# Patient Record
Sex: Male | Born: 1945 | Race: White | Hispanic: No | Marital: Married | Smoking: Former smoker
Health system: Southern US, Community
[De-identification: ages and names within clinical notes are randomized; demographics above are authoritative.]

## PROBLEM LIST (undated history)

## (undated) DIAGNOSIS — Z86010 Personal history of colonic polyps: Secondary | ICD-10-CM

## (undated) DIAGNOSIS — Z8719 Personal history of other diseases of the digestive system: Secondary | ICD-10-CM

## (undated) DIAGNOSIS — R0683 Snoring: Secondary | ICD-10-CM

## (undated) DIAGNOSIS — Z85828 Personal history of other malignant neoplasm of skin: Secondary | ICD-10-CM

## (undated) DIAGNOSIS — M545 Other chronic pain: Secondary | ICD-10-CM

## (undated) DIAGNOSIS — L501 Idiopathic urticaria: Secondary | ICD-10-CM

## (undated) DIAGNOSIS — N401 Enlarged prostate with lower urinary tract symptoms: Secondary | ICD-10-CM

## (undated) DIAGNOSIS — M51379 Other intervertebral disc degeneration, lumbosacral region without mention of lumbar back pain or lower extremity pain: Secondary | ICD-10-CM

## (undated) DIAGNOSIS — Z87442 Personal history of urinary calculi: Secondary | ICD-10-CM

## (undated) DIAGNOSIS — Z87438 Personal history of other diseases of male genital organs: Secondary | ICD-10-CM

## (undated) DIAGNOSIS — M5137 Other intervertebral disc degeneration, lumbosacral region: Secondary | ICD-10-CM

## (undated) DIAGNOSIS — G8929 Other chronic pain: Secondary | ICD-10-CM

## (undated) DIAGNOSIS — C61 Malignant neoplasm of prostate: Secondary | ICD-10-CM

## (undated) DIAGNOSIS — H35711 Central serous chorioretinopathy, right eye: Secondary | ICD-10-CM

## (undated) DIAGNOSIS — L508 Other urticaria: Secondary | ICD-10-CM

## (undated) DIAGNOSIS — M47816 Spondylosis without myelopathy or radiculopathy, lumbar region: Secondary | ICD-10-CM

## (undated) DIAGNOSIS — Z87820 Personal history of traumatic brain injury: Secondary | ICD-10-CM

## (undated) DIAGNOSIS — Z860101 Personal history of adenomatous and serrated colon polyps: Secondary | ICD-10-CM

## (undated) HISTORY — PX: PROSTATE BIOPSY: SHX241

## (undated) HISTORY — PX: TONSILLECTOMY: SUR1361

## (undated) HISTORY — PX: CATARACT EXTRACTION W/ INTRAOCULAR LENS  IMPLANT, BILATERAL: SHX1307

## (undated) HISTORY — PX: MOHS SURGERY: SUR867

---

## 2000-11-21 ENCOUNTER — Encounter (INDEPENDENT_AMBULATORY_CARE_PROVIDER_SITE_OTHER): Payer: Self-pay

## 2000-11-21 ENCOUNTER — Ambulatory Visit: Admission: RE | Admit: 2000-11-21 | Discharge: 2000-11-21 | Payer: Self-pay | Admitting: Gastroenterology

## 2004-03-27 ENCOUNTER — Ambulatory Visit (HOSPITAL_COMMUNITY): Admission: RE | Admit: 2004-03-27 | Discharge: 2004-03-27 | Payer: Self-pay | Admitting: Gastroenterology

## 2010-05-27 ENCOUNTER — Ambulatory Visit: Payer: Self-pay | Admitting: Vascular Surgery

## 2010-11-25 ENCOUNTER — Ambulatory Visit: Payer: Self-pay | Admitting: Vascular Surgery

## 2010-11-25 ENCOUNTER — Other Ambulatory Visit: Payer: Self-pay

## 2011-01-12 NOTE — Procedures (Signed)
DUPLEX DEEP VENOUS EXAM - LOWER EXTREMITY   INDICATION:  Right leg pain and swelling.   HISTORY:  Edema:  Occasional right ankle swelling.  Trauma/Surgery:  No.  Pain:  Right anterolateral shin pain for about 2 weeks.  PE:  No.  Previous DVT:  No.  Anticoagulants:  Patient states he takes aspirin.  Other:   DUPLEX EXAM:                CFV   SFV   PopV  PTV    GSV                R  L  R  L  R  L  R   L  R  L  Thrombosis    o  o  o     o     o      o  Spontaneous   +  +  +     +     +      +  Phasic        +  +  +     +     +      +  Augmentation  +  +  +     +     +      +  Compressible  +  +  +     +     +      +  Competent     +  +  +     +     +      +   Legend:  + - yes  o - no  p - partial  D - decreased   IMPRESSION:  No evidence of deep or superficial venous thrombosis noted  in the right lower extremity.    _____________________________  Leonides Sake, MD   CH/MEDQ  D:  05/27/2010  T:  05/27/2010  Job:  [id number]

## 2011-01-15 NOTE — Op Note (Signed)
NAME:  Carl Flores, HOAGLIN                       ACCOUNT NO.:  [id number]   MEDICAL RECORD NO.:  [id number]                   PATIENT TYPE:  AMB   LOCATION:  ENDO                                 FACILITY:  [facility]   PHYSICIAN:  James L. Malon Kindle., M.D.          DATE OF BIRTH:  May 14, 1946   DATE OF PROCEDURE:  03/27/2004  DATE OF DISCHARGE:                                 OPERATIVE REPORT   PROCEDURE:  Colonoscopy.   MEDICATIONS:  Fentanyl 100 mcg, Versed 10 mg IV.   INDICATIONS FOR PROCEDURE:  Strong family history of colon cancer with  previous history of polyps.   DESCRIPTION OF PROCEDURE:  The patient had been explained to the patient and  consent obtained.  The patient in the left lateral decubitus position, the  Olympus scope was inserted and advanced.  The patient had a very long  tortuous colon.  The pediatric adjustable scope was used.  Multiple position  change were made.  Eventually we were able to reach just above the cecum to  the level of the ileocecal valve.  The cecum was seen in the distance, could  not advance down into the tip of the cecum but it was grossly  normal from  there.  The scope was withdrawn and the ascending colon, transverse,  descending and sigmoid colon were seen well and no polyps were seen.  There  was no diverticular disease.  Again, a very long tortuous colon.  The rectum  was free of polyps.  The scope was withdrawn.  The patient tolerated the  procedure well.   ASSESSMENT:  1. History of previous colon polyps with negative colonoscopy, V12.72.  2. Family history of colon cancer, V16.0.   PLAN:  Will recommend yearly Hemoccults, repeating procedure in five years.  Will plan on using the adult colonoscope.                                               James L. Malon Kindle., M.D.    Waldron Session  D:  03/27/2004  T:  03/27/2004  Job:  [id number]   cc:   Marjory Lies, M.D.  [address]  [place]  [place] [address]  Fax: [phone]

## 2011-01-15 NOTE — Procedures (Signed)
[facility]  Patient:    Carl Flores, Carl Flores                      MRN: [id number] Proc. Date: 11/21/00 Attending:  Fayrene Fearing L. Randa Evens, M.D. CC:         Delorse Lek, M.D.   Procedure Report  PROCEDURE:  Colonoscopy and polypectomy.  ENDOSCOPIST:  Llana Aliment. Edwards, M.D.  MEDICATIONS:  Fentanyl 87.5 mcg, Versed 7 mg IV.  INDICATIONS:  Rectal bleeding in a gentleman with a very strong family history of colon cancer and colon polyps.  SCOPE:  Olympus adult video colonoscope.  DESCRIPTION OF PROCEDURE:  The procedure had been explained to the patient and consent obtained.  With the patient in the left lateral decubitus position, the Olympus adult video colonoscope was inserted and advanced under direct visualization.  The prep was excellent.  We were able to reach the transverse colon with the patient in the left lateral decubitus position and then placing him in the supine position, and finally in the right lateral decubitus position and using abdominal pressure, we were able to get down to the cecum. The scope was withdrawn, and the cecum, ascending colon, hepatic flexure, transverse colon, splenic flexure, descending and sigmoid colon were seen well upon removal.  In the proximal descending colon, a 0.75 cm polyp was encountered, removed with the snare, and sucked through the scope.  It did fragment.  There was no active bleeding.  No other polyps were seen in the descending or sigmoid colon.  Internal hemorrhoids were seen in the rectum. The scope was withdrawn.  The patient tolerated the procedure well.  The patient as maintained on low-flow oxygen and pulse oximetry throughout the procedure with no obvious problem.  ASSESSMENT: 1. Descending colon polyp removed. 2. Internal hemorrhoids.  PLAN:  Routine post polypectomy instructions.  Recommend repeat procedure in three years. DD:  11/21/00 TD:  11/22/00 Job: [id number]

## 2011-09-20 DIAGNOSIS — L820 Inflamed seborrheic keratosis: Secondary | ICD-10-CM | POA: Diagnosis not present

## 2011-09-20 DIAGNOSIS — D235 Other benign neoplasm of skin of trunk: Secondary | ICD-10-CM | POA: Diagnosis not present

## 2011-09-20 DIAGNOSIS — L570 Actinic keratosis: Secondary | ICD-10-CM | POA: Diagnosis not present

## 2011-11-23 ENCOUNTER — Ambulatory Visit (INDEPENDENT_AMBULATORY_CARE_PROVIDER_SITE_OTHER): Payer: Medicare Other | Admitting: Ophthalmology

## 2011-11-23 DIAGNOSIS — H43819 Vitreous degeneration, unspecified eye: Secondary | ICD-10-CM | POA: Diagnosis not present

## 2011-11-23 DIAGNOSIS — D3130 Benign neoplasm of unspecified choroid: Secondary | ICD-10-CM

## 2011-11-23 DIAGNOSIS — Z09 Encounter for follow-up examination after completed treatment for conditions other than malignant neoplasm: Secondary | ICD-10-CM | POA: Diagnosis not present

## 2011-11-23 DIAGNOSIS — H35719 Central serous chorioretinopathy, unspecified eye: Secondary | ICD-10-CM | POA: Diagnosis not present

## 2012-01-04 DIAGNOSIS — E8809 Other disorders of plasma-protein metabolism, not elsewhere classified: Secondary | ICD-10-CM | POA: Diagnosis not present

## 2012-01-04 DIAGNOSIS — L509 Urticaria, unspecified: Secondary | ICD-10-CM | POA: Diagnosis not present

## 2012-02-23 DIAGNOSIS — R109 Unspecified abdominal pain: Secondary | ICD-10-CM | POA: Diagnosis not present

## 2012-02-23 DIAGNOSIS — J069 Acute upper respiratory infection, unspecified: Secondary | ICD-10-CM | POA: Diagnosis not present

## 2012-02-23 DIAGNOSIS — R5381 Other malaise: Secondary | ICD-10-CM | POA: Diagnosis not present

## 2012-02-23 DIAGNOSIS — L509 Urticaria, unspecified: Secondary | ICD-10-CM | POA: Diagnosis not present

## 2012-02-23 DIAGNOSIS — R5383 Other fatigue: Secondary | ICD-10-CM | POA: Diagnosis not present

## 2012-03-21 DIAGNOSIS — M25519 Pain in unspecified shoulder: Secondary | ICD-10-CM | POA: Diagnosis not present

## 2012-04-05 DIAGNOSIS — K648 Other hemorrhoids: Secondary | ICD-10-CM | POA: Diagnosis not present

## 2012-04-05 DIAGNOSIS — Z09 Encounter for follow-up examination after completed treatment for conditions other than malignant neoplasm: Secondary | ICD-10-CM | POA: Diagnosis not present

## 2012-04-05 DIAGNOSIS — Z86010 Personal history of colonic polyps: Secondary | ICD-10-CM | POA: Diagnosis not present

## 2012-04-06 DIAGNOSIS — Z13220 Encounter for screening for lipoid disorders: Secondary | ICD-10-CM | POA: Diagnosis not present

## 2012-04-06 DIAGNOSIS — Z125 Encounter for screening for malignant neoplasm of prostate: Secondary | ICD-10-CM | POA: Diagnosis not present

## 2012-04-06 DIAGNOSIS — Z0000 Encounter for general adult medical examination without abnormal findings: Secondary | ICD-10-CM | POA: Diagnosis not present

## 2012-04-06 DIAGNOSIS — R1900 Intra-abdominal and pelvic swelling, mass and lump, unspecified site: Secondary | ICD-10-CM | POA: Diagnosis not present

## 2012-04-06 DIAGNOSIS — Z136 Encounter for screening for cardiovascular disorders: Secondary | ICD-10-CM | POA: Diagnosis not present

## 2012-06-11 DIAGNOSIS — Z23 Encounter for immunization: Secondary | ICD-10-CM | POA: Diagnosis not present

## 2012-07-04 DIAGNOSIS — L509 Urticaria, unspecified: Secondary | ICD-10-CM | POA: Diagnosis not present

## 2012-07-04 DIAGNOSIS — E8809 Other disorders of plasma-protein metabolism, not elsewhere classified: Secondary | ICD-10-CM | POA: Diagnosis not present

## 2012-10-02 DIAGNOSIS — IMO0002 Reserved for concepts with insufficient information to code with codable children: Secondary | ICD-10-CM | POA: Diagnosis not present

## 2012-10-23 DIAGNOSIS — N411 Chronic prostatitis: Secondary | ICD-10-CM | POA: Diagnosis not present

## 2012-10-23 DIAGNOSIS — N400 Enlarged prostate without lower urinary tract symptoms: Secondary | ICD-10-CM | POA: Diagnosis not present

## 2012-11-22 ENCOUNTER — Ambulatory Visit (INDEPENDENT_AMBULATORY_CARE_PROVIDER_SITE_OTHER): Payer: Medicare Other | Admitting: Ophthalmology

## 2013-01-08 ENCOUNTER — Ambulatory Visit (INDEPENDENT_AMBULATORY_CARE_PROVIDER_SITE_OTHER): Payer: Medicare Other | Admitting: Ophthalmology

## 2013-01-08 DIAGNOSIS — H43819 Vitreous degeneration, unspecified eye: Secondary | ICD-10-CM | POA: Diagnosis not present

## 2013-01-08 DIAGNOSIS — D3130 Benign neoplasm of unspecified choroid: Secondary | ICD-10-CM

## 2013-01-08 DIAGNOSIS — Z09 Encounter for follow-up examination after completed treatment for conditions other than malignant neoplasm: Secondary | ICD-10-CM | POA: Diagnosis not present

## 2013-01-08 DIAGNOSIS — H35719 Central serous chorioretinopathy, unspecified eye: Secondary | ICD-10-CM

## 2013-01-09 DIAGNOSIS — L509 Urticaria, unspecified: Secondary | ICD-10-CM | POA: Insufficient documentation

## 2013-01-09 DIAGNOSIS — H35719 Central serous chorioretinopathy, unspecified eye: Secondary | ICD-10-CM | POA: Insufficient documentation

## 2013-04-10 DIAGNOSIS — Z0000 Encounter for general adult medical examination without abnormal findings: Secondary | ICD-10-CM | POA: Diagnosis not present

## 2013-04-10 DIAGNOSIS — Z125 Encounter for screening for malignant neoplasm of prostate: Secondary | ICD-10-CM | POA: Diagnosis not present

## 2013-04-10 DIAGNOSIS — Z79899 Other long term (current) drug therapy: Secondary | ICD-10-CM | POA: Diagnosis not present

## 2013-04-10 DIAGNOSIS — L509 Urticaria, unspecified: Secondary | ICD-10-CM | POA: Diagnosis not present

## 2013-04-10 DIAGNOSIS — R1900 Intra-abdominal and pelvic swelling, mass and lump, unspecified site: Secondary | ICD-10-CM | POA: Diagnosis not present

## 2013-04-15 ENCOUNTER — Emergency Department (HOSPITAL_COMMUNITY)
Admission: EM | Admit: 2013-04-15 | Discharge: 2013-04-15 | Disposition: A | Discharge status: Home / Self Care | Payer: Medicare Other | Attending: Emergency Medicine | Admitting: Emergency Medicine

## 2013-04-15 ENCOUNTER — Emergency Department (HOSPITAL_COMMUNITY): Payer: Medicare Other

## 2013-04-15 ENCOUNTER — Encounter (HOSPITAL_COMMUNITY): Payer: Self-pay | Admitting: *Deleted

## 2013-04-15 DIAGNOSIS — Z7982 Long term (current) use of aspirin: Secondary | ICD-10-CM | POA: Insufficient documentation

## 2013-04-15 DIAGNOSIS — K5900 Constipation, unspecified: Secondary | ICD-10-CM | POA: Diagnosis not present

## 2013-04-15 DIAGNOSIS — IMO0001 Reserved for inherently not codable concepts without codable children: Secondary | ICD-10-CM | POA: Diagnosis not present

## 2013-04-15 DIAGNOSIS — R110 Nausea: Secondary | ICD-10-CM | POA: Insufficient documentation

## 2013-04-15 DIAGNOSIS — M549 Dorsalgia, unspecified: Secondary | ICD-10-CM | POA: Insufficient documentation

## 2013-04-15 DIAGNOSIS — R109 Unspecified abdominal pain: Secondary | ICD-10-CM | POA: Diagnosis not present

## 2013-04-15 DIAGNOSIS — Z79899 Other long term (current) drug therapy: Secondary | ICD-10-CM | POA: Insufficient documentation

## 2013-04-15 DIAGNOSIS — N200 Calculus of kidney: Secondary | ICD-10-CM | POA: Diagnosis not present

## 2013-04-15 DIAGNOSIS — R079 Chest pain, unspecified: Secondary | ICD-10-CM | POA: Diagnosis not present

## 2013-04-15 DIAGNOSIS — N201 Calculus of ureter: Secondary | ICD-10-CM | POA: Insufficient documentation

## 2013-04-15 LAB — COMPREHENSIVE METABOLIC PANEL
AST: 32 U/L (ref 0–37)
Albumin: 4 g/dL (ref 3.5–5.2)
Alkaline Phosphatase: 77 U/L (ref 39–117)
CO2: 26 mEq/L (ref 19–32)
Chloride: 101 mEq/L (ref 96–112)
Creatinine, Ser: 1.27 mg/dL (ref 0.50–1.35)
GFR calc non Af Amer: 56 mL/min — ABNORMAL LOW (ref >90)
Potassium: 4.4 mEq/L (ref 3.5–5.1)
Total Bilirubin: 0.4 mg/dL (ref 0.3–1.2)

## 2013-04-15 LAB — CBC WITH DIFFERENTIAL/PLATELET
Basophils Absolute: 0 10*3/uL (ref 0.0–0.1)
Basophils Relative: 0 % (ref 0–1)
HCT: 44.3 % (ref 39.0–52.0)
Hemoglobin: 15.1 g/dL (ref 13.0–17.0)
Lymphocytes Relative: 10 % — ABNORMAL LOW (ref 12–46)
MCHC: 34.1 g/dL (ref 30.0–36.0)
Monocytes Absolute: 0.3 10*3/uL (ref 0.1–1.0)
Monocytes Relative: 5 % (ref 3–12)
Neutro Abs: 5.1 10*3/uL (ref 1.7–7.7)
Neutrophils Relative %: 84 % — ABNORMAL HIGH (ref 43–77)
RDW: 12.8 % (ref 11.5–15.5)
WBC: 6.1 10*3/uL (ref 4.0–10.5)

## 2013-04-15 LAB — URINALYSIS, ROUTINE W REFLEX MICROSCOPIC
Bilirubin Urine: NEGATIVE
Glucose, UA: NEGATIVE mg/dL
Ketones, ur: NEGATIVE mg/dL
Protein, ur: NEGATIVE mg/dL
Urobilinogen, UA: 0.2 mg/dL (ref 0.0–1.0)

## 2013-04-15 MED ORDER — KETOROLAC TROMETHAMINE 10 MG PO TABS: 10.0000 mg | ORAL_TABLET | Freq: Four times a day (QID) | ORAL | Status: DC | PRN

## 2013-04-15 MED ORDER — POLYETHYLENE GLYCOL 3350 17 G PO PACK: 17.0000 g | PACK | Freq: Every day | ORAL | Status: DC

## 2013-04-15 MED ORDER — KETOROLAC TROMETHAMINE 30 MG/ML IJ SOLN
30.0000 mg | Freq: Once | INTRAMUSCULAR | Status: AC
  Administered 2013-04-15: 30 mg via INTRAVENOUS
  Filled 2013-04-15: qty 1

## 2013-04-15 MED ORDER — METHOCARBAMOL 100 MG/ML IJ SOLN
500.0000 mg | Freq: Once | INTRAMUSCULAR | Status: DC
  Filled 2013-04-15: qty 5

## 2013-04-15 MED ORDER — SODIUM CHLORIDE 0.9 % IV BOLUS (SEPSIS)
500.0000 mL | Freq: Once | INTRAVENOUS | Status: AC
  Administered 2013-04-15: 500 mL via INTRAVENOUS

## 2013-04-15 MED ORDER — HYDROCODONE-ACETAMINOPHEN 5-325 MG PO TABS: 1.0000 | ORAL_TABLET | ORAL | Status: DC | PRN

## 2013-04-15 MED ORDER — ONDANSETRON 4 MG PO TBDP: ORAL_TABLET | ORAL | Status: DC

## 2013-04-15 MED ORDER — TAMSULOSIN HCL 0.4 MG PO CAPS: 0.4000 mg | ORAL_CAPSULE | Freq: Every day | ORAL | Status: DC

## 2013-04-15 NOTE — ED Notes (Signed)
Lt lower back paqin lt sided since 0500am today.  No bloody urine voids ok

## 2013-04-15 NOTE — ED Notes (Signed)
Sudden onset Right flank pain-- started 0530-- with a wave of nausea-- woke up from sleep.

## 2013-04-15 NOTE — ED Provider Notes (Signed)
CSN: [id number]     Arrival date & time 04/15/13  8295 History     First MD Initiated Contact with Patient 04/15/13 0720     Chief Complaint  Patient presents with  . Flank Pain   (Consider location/radiation/quality/duration/timing/severity/associated sxs/prior Treatment) HPI Pt woke at 0500 with R flank pain. Pain is constant and worse with palpation and movement. No urinary symptoms. No fever or chills. Pt denies trauma though admits to working out yesterday. No radiation to abd or down legs. No weakness or numbness. +episode of nausea that has passed.   History reviewed. No pertinent past medical history. History reviewed. No pertinent past surgical history. No family history on file. History  Substance Use Topics  . Smoking status: Never Smoker   . Smokeless tobacco: Not on file  . Alcohol Use: Yes    Review of Systems  Constitutional: Negative for fever, chills and fatigue.  HENT: Negative for neck pain.   Respiratory: Negative for cough, shortness of breath and wheezing.   Cardiovascular: Negative for chest pain.  Gastrointestinal: Positive for nausea. Negative for vomiting, abdominal pain, diarrhea and constipation.  Genitourinary: Positive for flank pain. Negative for dysuria, frequency and hematuria.  Musculoskeletal: Positive for myalgias and back pain.  Skin: Negative for rash and wound.  Neurological: Negative for dizziness, weakness, light-headedness, numbness and headaches.  All other systems reviewed and are negative.    Allergies  Erythromycin and Sulfa antibiotics  Home Medications   Current Outpatient Rx  Name  Route  Sig  Dispense  Refill  . aspirin 325 MG tablet   Oral   Take 325 mg by mouth daily.         Marland Kitchen Bioflavonoid Products (GRAPE SEED PO)   Oral   Take by mouth.         . cetirizine (ZYRTEC) 10 MG tablet   Oral   Take 20 mg by mouth daily.         . ciprofloxacin (CIPRO) 250 MG tablet   Oral   Take 250 mg by mouth daily as  needed (for pain).         . hydroxychloroquine (PLAQUENIL) 200 MG tablet   Oral   Take 200 mg by mouth 2 (two) times daily.         . Multiple Vitamins-Minerals (OCUVITE PO)   Oral   Take 1 tablet by mouth daily.         . ranitidine (ZANTAC) 150 MG tablet   Oral   Take 150 mg by mouth daily.         Marland Kitchen Specialty Vitamins Products (PROSTATE PO)   Oral   Take 1 tablet by mouth daily.         Marland Kitchen HYDROcodone-acetaminophen (NORCO) 5-325 MG per tablet   Oral   Take 1 tablet by mouth every 4 (four) hours as needed for pain.   10 tablet   0   . ketorolac (TORADOL) 10 MG tablet   Oral   Take 1 tablet (10 mg total) by mouth every 6 (six) hours as needed for pain.   20 tablet   0   . ondansetron (ZOFRAN ODT) 4 MG disintegrating tablet      4mg  ODT q4 hours prn nausea/vomit   8 tablet   0   . polyethylene glycol (MIRALAX / GLYCOLAX) packet   Oral   Take 17 g by mouth daily.   14 each   0   . tamsulosin (FLOMAX) 0.4 MG CAPS  capsule   Oral   Take 1 capsule (0.4 mg total) by mouth daily.   10 capsule   0    BP 106/55  Pulse 55  Temp(Src) 98.1 F (36.7 C) (Oral)  Resp 20  SpO2 97% Physical Exam  Nursing note and vitals reviewed. Constitutional: He is oriented to person, place, and time. He appears well-developed and well-nourished. No distress.  HENT:  Head: Normocephalic and atraumatic.  Mouth/Throat: Oropharynx is clear and moist.  Eyes: EOM are normal. Pupils are equal, round, and reactive to light.  Neck: Normal range of motion. Neck supple.  Cardiovascular: Normal rate and regular rhythm.   Pulmonary/Chest: Effort normal. No respiratory distress. He has no wheezes. He has rales (few scattered rhochi).  Abdominal: Soft. Bowel sounds are normal. He exhibits no distension and no mass. There is no tenderness. There is no rebound and no guarding.  Musculoskeletal: Normal range of motion. He exhibits tenderness (TTP R lower thoracic/ upper lumbar paraspinal  muscle. No midline TTP. No calf swelling or pain). He exhibits no edema.  Neurological: He is alert and oriented to person, place, and time.  5/5 motor in all ext, sensation intact  Skin: Skin is warm and dry. No rash noted. No erythema.  Psychiatric: He has a normal mood and affect. His behavior is normal.    ED Course   Procedures (including critical care time)  Labs Reviewed  CBC WITH DIFFERENTIAL - Abnormal; Notable for the following:    Neutrophils Relative % 84 (*)    Lymphocytes Relative 10 (*)    Lymphs Abs 0.6 (*)    All other components within normal limits  COMPREHENSIVE METABOLIC PANEL - Abnormal; Notable for the following:    Glucose, Bld 122 (*)    BUN 29 (*)    GFR calc non Af Amer 56 (*)    GFR calc Af Amer 65 (*)    All other components within normal limits  URINALYSIS, ROUTINE W REFLEX MICROSCOPIC - Abnormal; Notable for the following:    Hgb urine dipstick LARGE (*)    All other components within normal limits  URINE MICROSCOPIC-ADD ON   Ct Abdomen Pelvis Wo Contrast  04/15/2013   *RADIOLOGY REPORT*  Clinical Data: Right flank pain  CT ABDOMEN AND PELVIS WITHOUT CONTRAST  Technique:  Multidetector CT imaging of the abdomen and pelvis was performed following the standard protocol without intravenous contrast.  Comparison: None.  Findings: Sagittal images of the spine are unremarkable.  Lung bases are unremarkable.  Tiny hiatal hernia.  Unenhanced liver shows no biliary ductal dilatation.  A cyst in left hepatic dome measures 2.4 cm.  No calcified gallstones are noted within gallbladder.  The unenhanced pancreas, spleen and adrenal glands are unremarkable. Moderate stool throughout the colon.  Significant stool noted within cecum.  No pericecal inflammation.  The terminal ileum is unremarkable.  No aortic aneurysm.  There is mild right hydronephrosis and right hydroureter.  Mild right perinephric stranding.  Nonobstructive calcified calculus is noted in the upper pole of  the right kidney measures 2 mm. Bilateral no proximal or mid ureteral calcified calculi are noted.  In axial image 81 there is a calcified obstructive calculus in the right UVJ/urinary bladder wall measures about 4 mm.  Urinary bladder is under distended.  Small bilateral inguinal scrotal canal hernia containing fat without evidence of acute complication.  There is no small bowel obstruction.  No ascites or free air.  No adenopathy.  Prostate gland measures 4.8 x  3.5 cm.  Seminal vesicles are unremarkable.  No destructive bony lesions are noted within pelvis.  No inguinal adenopathy.  IMPRESSION:  1.  There is mild right hydronephrosis and right hydroureter.  Mild right perinephric stranding. 2.   Right nonobstructive nephrolithiasis. 3.  There is 4 mm calcified obstructive calculus in the right UVJ/urinary bladder wall. 4.  Stool noted throughout the colon.  Significant stool noted in the cecum.  No pericecal inflammation. 5.  No small bowel obstruction.   Original Report Authenticated By: Natasha Mead, M.D.   Dg Chest 2 View  04/15/2013   *RADIOLOGY REPORT*  Clinical Data: Right flank pain.  CHEST - 2 VIEW  Comparison:  08/15/2007  Findings:  The heart size and mediastinal contours are within normal limits.  Both lungs are clear.  The visualized skeletal structures are unremarkable.  IMPRESSION: No active cardiopulmonary disease.   Original Report Authenticated By: Richarda Overlie, M.D.   Dg Abd 1 View  04/15/2013   *RADIOLOGY REPORT*  Clinical Data: Right flank pain.  ABDOMEN - 1 VIEW  Comparison: None.  Findings: There is a large amount of stool in the right abdomen and upper abdomen.  No large stones overlying the renal shadows or along the expected course of the ureters.  Calcifications in the pelvis are suggestive for phleboliths.  Nonobstructive bowel gas pattern.  IMPRESSION: Large amount of stool in the right and upper abdomen.   Original Report Authenticated By: Richarda Overlie, M.D.   1. Right ureteral stone    2. Constipation     MDM  Pt states pain is improved. Given return precautions and urology f/u.   Loren Racer, MD 04/15/13 [phone]

## 2013-04-23 DIAGNOSIS — N200 Calculus of kidney: Secondary | ICD-10-CM | POA: Diagnosis not present

## 2013-05-10 DIAGNOSIS — Z23 Encounter for immunization: Secondary | ICD-10-CM | POA: Diagnosis not present

## 2013-05-11 DIAGNOSIS — N200 Calculus of kidney: Secondary | ICD-10-CM | POA: Diagnosis not present

## 2013-07-17 DIAGNOSIS — L509 Urticaria, unspecified: Secondary | ICD-10-CM | POA: Diagnosis not present

## 2014-02-04 ENCOUNTER — Ambulatory Visit (INDEPENDENT_AMBULATORY_CARE_PROVIDER_SITE_OTHER): Payer: Medicare Other | Admitting: Ophthalmology

## 2014-02-04 DIAGNOSIS — Z09 Encounter for follow-up examination after completed treatment for conditions other than malignant neoplasm: Secondary | ICD-10-CM | POA: Diagnosis not present

## 2014-02-04 DIAGNOSIS — H3530 Unspecified macular degeneration: Secondary | ICD-10-CM

## 2014-02-04 DIAGNOSIS — D3130 Benign neoplasm of unspecified choroid: Secondary | ICD-10-CM

## 2014-02-04 DIAGNOSIS — H43819 Vitreous degeneration, unspecified eye: Secondary | ICD-10-CM

## 2014-02-04 DIAGNOSIS — H35719 Central serous chorioretinopathy, unspecified eye: Secondary | ICD-10-CM | POA: Diagnosis not present

## 2014-03-27 DIAGNOSIS — L570 Actinic keratosis: Secondary | ICD-10-CM | POA: Diagnosis not present

## 2014-03-27 DIAGNOSIS — C44211 Basal cell carcinoma of skin of unspecified ear and external auricular canal: Secondary | ICD-10-CM | POA: Diagnosis not present

## 2014-03-27 DIAGNOSIS — Z85828 Personal history of other malignant neoplasm of skin: Secondary | ICD-10-CM | POA: Diagnosis not present

## 2014-03-27 DIAGNOSIS — D235 Other benign neoplasm of skin of trunk: Secondary | ICD-10-CM | POA: Diagnosis not present

## 2014-04-01 DIAGNOSIS — T8140XA Infection following a procedure, unspecified, initial encounter: Secondary | ICD-10-CM | POA: Diagnosis not present

## 2014-04-02 DIAGNOSIS — H60339 Swimmer's ear, unspecified ear: Secondary | ICD-10-CM | POA: Diagnosis not present

## 2014-04-03 DIAGNOSIS — H9209 Otalgia, unspecified ear: Secondary | ICD-10-CM | POA: Diagnosis not present

## 2014-04-12 DIAGNOSIS — L509 Urticaria, unspecified: Secondary | ICD-10-CM | POA: Diagnosis not present

## 2014-04-12 DIAGNOSIS — Z0000 Encounter for general adult medical examination without abnormal findings: Secondary | ICD-10-CM | POA: Diagnosis not present

## 2014-04-12 DIAGNOSIS — R030 Elevated blood-pressure reading, without diagnosis of hypertension: Secondary | ICD-10-CM | POA: Diagnosis not present

## 2014-04-12 DIAGNOSIS — Z23 Encounter for immunization: Secondary | ICD-10-CM | POA: Diagnosis not present

## 2014-04-29 DIAGNOSIS — Z85828 Personal history of other malignant neoplasm of skin: Secondary | ICD-10-CM | POA: Diagnosis not present

## 2014-04-29 DIAGNOSIS — B351 Tinea unguium: Secondary | ICD-10-CM | POA: Diagnosis not present

## 2014-05-08 DIAGNOSIS — Z23 Encounter for immunization: Secondary | ICD-10-CM | POA: Diagnosis not present

## 2014-08-06 DIAGNOSIS — L509 Urticaria, unspecified: Secondary | ICD-10-CM | POA: Diagnosis not present

## 2015-02-19 ENCOUNTER — Ambulatory Visit (INDEPENDENT_AMBULATORY_CARE_PROVIDER_SITE_OTHER): Payer: Medicare Other | Admitting: Ophthalmology

## 2015-02-19 DIAGNOSIS — Z79899 Other long term (current) drug therapy: Secondary | ICD-10-CM | POA: Diagnosis not present

## 2015-02-19 DIAGNOSIS — H43813 Vitreous degeneration, bilateral: Secondary | ICD-10-CM

## 2015-02-19 DIAGNOSIS — L508 Other urticaria: Secondary | ICD-10-CM | POA: Diagnosis not present

## 2015-02-19 DIAGNOSIS — H35711 Central serous chorioretinopathy, right eye: Secondary | ICD-10-CM | POA: Diagnosis not present

## 2015-02-19 DIAGNOSIS — D3131 Benign neoplasm of right choroid: Secondary | ICD-10-CM | POA: Diagnosis not present

## 2015-04-15 DIAGNOSIS — Z5181 Encounter for therapeutic drug level monitoring: Secondary | ICD-10-CM | POA: Diagnosis not present

## 2015-04-15 DIAGNOSIS — L509 Urticaria, unspecified: Secondary | ICD-10-CM | POA: Diagnosis not present

## 2015-04-15 DIAGNOSIS — M25511 Pain in right shoulder: Secondary | ICD-10-CM | POA: Diagnosis not present

## 2015-04-15 DIAGNOSIS — Z0000 Encounter for general adult medical examination without abnormal findings: Secondary | ICD-10-CM | POA: Diagnosis not present

## 2015-04-15 DIAGNOSIS — D489 Neoplasm of uncertain behavior, unspecified: Secondary | ICD-10-CM | POA: Diagnosis not present

## 2015-04-15 DIAGNOSIS — K4090 Unilateral inguinal hernia, without obstruction or gangrene, not specified as recurrent: Secondary | ICD-10-CM | POA: Diagnosis not present

## 2015-05-24 DIAGNOSIS — Z23 Encounter for immunization: Secondary | ICD-10-CM | POA: Diagnosis not present

## 2015-07-04 DIAGNOSIS — M1712 Unilateral primary osteoarthritis, left knee: Secondary | ICD-10-CM | POA: Diagnosis not present

## 2015-07-22 DIAGNOSIS — Z6827 Body mass index (BMI) 27.0-27.9, adult: Secondary | ICD-10-CM | POA: Diagnosis not present

## 2015-07-22 DIAGNOSIS — L509 Urticaria, unspecified: Secondary | ICD-10-CM | POA: Diagnosis not present

## 2015-07-22 DIAGNOSIS — T783XXD Angioneurotic edema, subsequent encounter: Secondary | ICD-10-CM | POA: Diagnosis not present

## 2015-07-30 DIAGNOSIS — M1712 Unilateral primary osteoarthritis, left knee: Secondary | ICD-10-CM | POA: Diagnosis not present

## 2015-08-05 DIAGNOSIS — M1712 Unilateral primary osteoarthritis, left knee: Secondary | ICD-10-CM | POA: Diagnosis not present

## 2015-08-13 DIAGNOSIS — M1712 Unilateral primary osteoarthritis, left knee: Secondary | ICD-10-CM | POA: Diagnosis not present

## 2015-09-24 DIAGNOSIS — M1712 Unilateral primary osteoarthritis, left knee: Secondary | ICD-10-CM | POA: Diagnosis not present

## 2016-02-23 ENCOUNTER — Ambulatory Visit (INDEPENDENT_AMBULATORY_CARE_PROVIDER_SITE_OTHER): Payer: Medicare Other | Admitting: Ophthalmology

## 2016-02-23 DIAGNOSIS — H35711 Central serous chorioretinopathy, right eye: Secondary | ICD-10-CM

## 2016-02-23 DIAGNOSIS — H43813 Vitreous degeneration, bilateral: Secondary | ICD-10-CM | POA: Diagnosis not present

## 2016-02-23 DIAGNOSIS — D3131 Benign neoplasm of right choroid: Secondary | ICD-10-CM | POA: Diagnosis not present

## 2016-04-15 DIAGNOSIS — M2041 Other hammer toe(s) (acquired), right foot: Secondary | ICD-10-CM | POA: Diagnosis not present

## 2016-04-15 DIAGNOSIS — L509 Urticaria, unspecified: Secondary | ICD-10-CM | POA: Diagnosis not present

## 2016-04-15 DIAGNOSIS — B351 Tinea unguium: Secondary | ICD-10-CM | POA: Diagnosis not present

## 2016-04-15 DIAGNOSIS — K4090 Unilateral inguinal hernia, without obstruction or gangrene, not specified as recurrent: Secondary | ICD-10-CM | POA: Diagnosis not present

## 2016-04-15 DIAGNOSIS — Z5181 Encounter for therapeutic drug level monitoring: Secondary | ICD-10-CM | POA: Diagnosis not present

## 2016-04-15 DIAGNOSIS — Z0000 Encounter for general adult medical examination without abnormal findings: Secondary | ICD-10-CM | POA: Diagnosis not present

## 2016-06-29 DIAGNOSIS — Z23 Encounter for immunization: Secondary | ICD-10-CM | POA: Diagnosis not present

## 2016-07-01 DIAGNOSIS — H35719 Central serous chorioretinopathy, unspecified eye: Secondary | ICD-10-CM | POA: Diagnosis not present

## 2016-07-01 DIAGNOSIS — L508 Other urticaria: Secondary | ICD-10-CM | POA: Diagnosis not present

## 2016-08-21 DIAGNOSIS — G43A0 Cyclical vomiting, not intractable: Secondary | ICD-10-CM | POA: Diagnosis not present

## 2016-08-21 DIAGNOSIS — J00 Acute nasopharyngitis [common cold]: Secondary | ICD-10-CM | POA: Diagnosis not present

## 2016-09-23 DIAGNOSIS — Z125 Encounter for screening for malignant neoplasm of prostate: Secondary | ICD-10-CM | POA: Diagnosis not present

## 2016-09-23 DIAGNOSIS — R310 Gross hematuria: Secondary | ICD-10-CM | POA: Diagnosis not present

## 2016-09-30 DIAGNOSIS — N200 Calculus of kidney: Secondary | ICD-10-CM | POA: Diagnosis not present

## 2016-09-30 DIAGNOSIS — R310 Gross hematuria: Secondary | ICD-10-CM | POA: Diagnosis not present

## 2016-10-07 DIAGNOSIS — N200 Calculus of kidney: Secondary | ICD-10-CM | POA: Diagnosis not present

## 2016-10-07 DIAGNOSIS — D3501 Benign neoplasm of right adrenal gland: Secondary | ICD-10-CM | POA: Diagnosis not present

## 2016-10-07 DIAGNOSIS — R310 Gross hematuria: Secondary | ICD-10-CM | POA: Diagnosis not present

## 2016-10-07 DIAGNOSIS — N281 Cyst of kidney, acquired: Secondary | ICD-10-CM | POA: Diagnosis not present

## 2016-10-08 DIAGNOSIS — I2510 Atherosclerotic heart disease of native coronary artery without angina pectoris: Secondary | ICD-10-CM | POA: Diagnosis not present

## 2017-03-14 ENCOUNTER — Ambulatory Visit (INDEPENDENT_AMBULATORY_CARE_PROVIDER_SITE_OTHER): Payer: Medicare Other | Admitting: Ophthalmology

## 2017-03-14 DIAGNOSIS — H35711 Central serous chorioretinopathy, right eye: Secondary | ICD-10-CM

## 2017-03-14 DIAGNOSIS — D3131 Benign neoplasm of right choroid: Secondary | ICD-10-CM | POA: Diagnosis not present

## 2017-03-14 DIAGNOSIS — H43813 Vitreous degeneration, bilateral: Secondary | ICD-10-CM | POA: Diagnosis not present

## 2017-03-18 DIAGNOSIS — L509 Urticaria, unspecified: Secondary | ICD-10-CM | POA: Diagnosis not present

## 2017-03-23 DIAGNOSIS — Z6826 Body mass index (BMI) 26.0-26.9, adult: Secondary | ICD-10-CM | POA: Diagnosis not present

## 2017-03-23 DIAGNOSIS — L509 Urticaria, unspecified: Secondary | ICD-10-CM | POA: Diagnosis not present

## 2017-03-23 DIAGNOSIS — L501 Idiopathic urticaria: Secondary | ICD-10-CM | POA: Diagnosis not present

## 2017-03-23 DIAGNOSIS — L508 Other urticaria: Secondary | ICD-10-CM | POA: Diagnosis not present

## 2017-04-11 ENCOUNTER — Encounter (INDEPENDENT_AMBULATORY_CARE_PROVIDER_SITE_OTHER): Payer: Medicare Other | Admitting: Ophthalmology

## 2017-04-11 DIAGNOSIS — L508 Other urticaria: Secondary | ICD-10-CM | POA: Diagnosis not present

## 2017-04-20 DIAGNOSIS — Z23 Encounter for immunization: Secondary | ICD-10-CM | POA: Diagnosis not present

## 2017-04-20 DIAGNOSIS — M2041 Other hammer toe(s) (acquired), right foot: Secondary | ICD-10-CM | POA: Diagnosis not present

## 2017-04-20 DIAGNOSIS — Z0000 Encounter for general adult medical examination without abnormal findings: Secondary | ICD-10-CM | POA: Diagnosis not present

## 2017-04-20 DIAGNOSIS — L509 Urticaria, unspecified: Secondary | ICD-10-CM | POA: Diagnosis not present

## 2017-04-20 DIAGNOSIS — Z1159 Encounter for screening for other viral diseases: Secondary | ICD-10-CM | POA: Diagnosis not present

## 2017-05-06 DIAGNOSIS — Z23 Encounter for immunization: Secondary | ICD-10-CM | POA: Diagnosis not present

## 2017-06-28 DIAGNOSIS — L508 Other urticaria: Secondary | ICD-10-CM | POA: Insufficient documentation

## 2017-06-28 DIAGNOSIS — L509 Urticaria, unspecified: Secondary | ICD-10-CM | POA: Diagnosis not present

## 2017-06-28 DIAGNOSIS — L501 Idiopathic urticaria: Secondary | ICD-10-CM | POA: Diagnosis not present

## 2017-08-17 DIAGNOSIS — Z86010 Personal history of colonic polyps: Secondary | ICD-10-CM | POA: Diagnosis not present

## 2017-08-17 DIAGNOSIS — K635 Polyp of colon: Secondary | ICD-10-CM | POA: Diagnosis not present

## 2017-08-17 DIAGNOSIS — D126 Benign neoplasm of colon, unspecified: Secondary | ICD-10-CM | POA: Diagnosis not present

## 2017-08-26 DIAGNOSIS — D126 Benign neoplasm of colon, unspecified: Secondary | ICD-10-CM | POA: Diagnosis not present

## 2017-08-26 DIAGNOSIS — K635 Polyp of colon: Secondary | ICD-10-CM | POA: Diagnosis not present

## 2017-12-27 DIAGNOSIS — L508 Other urticaria: Secondary | ICD-10-CM | POA: Diagnosis not present

## 2018-01-27 DIAGNOSIS — D2372 Other benign neoplasm of skin of left lower limb, including hip: Secondary | ICD-10-CM | POA: Diagnosis not present

## 2018-01-27 DIAGNOSIS — R198 Other specified symptoms and signs involving the digestive system and abdomen: Secondary | ICD-10-CM | POA: Diagnosis not present

## 2018-03-15 ENCOUNTER — Encounter (INDEPENDENT_AMBULATORY_CARE_PROVIDER_SITE_OTHER): Payer: Medicare Other | Admitting: Ophthalmology

## 2018-03-15 DIAGNOSIS — H43813 Vitreous degeneration, bilateral: Secondary | ICD-10-CM | POA: Diagnosis not present

## 2018-03-15 DIAGNOSIS — M069 Rheumatoid arthritis, unspecified: Secondary | ICD-10-CM | POA: Diagnosis not present

## 2018-03-15 DIAGNOSIS — H35711 Central serous chorioretinopathy, right eye: Secondary | ICD-10-CM

## 2018-03-15 DIAGNOSIS — D3131 Benign neoplasm of right choroid: Secondary | ICD-10-CM | POA: Diagnosis not present

## 2018-04-19 DIAGNOSIS — Z0000 Encounter for general adult medical examination without abnormal findings: Secondary | ICD-10-CM | POA: Diagnosis not present

## 2018-04-19 DIAGNOSIS — Z23 Encounter for immunization: Secondary | ICD-10-CM | POA: Diagnosis not present

## 2018-04-19 DIAGNOSIS — L509 Urticaria, unspecified: Secondary | ICD-10-CM | POA: Diagnosis not present

## 2018-04-21 DIAGNOSIS — R3915 Urgency of urination: Secondary | ICD-10-CM | POA: Diagnosis not present

## 2018-04-21 DIAGNOSIS — Z125 Encounter for screening for malignant neoplasm of prostate: Secondary | ICD-10-CM | POA: Diagnosis not present

## 2018-04-21 DIAGNOSIS — N401 Enlarged prostate with lower urinary tract symptoms: Secondary | ICD-10-CM | POA: Diagnosis not present

## 2018-06-27 DIAGNOSIS — R972 Elevated prostate specific antigen [PSA]: Secondary | ICD-10-CM | POA: Diagnosis not present

## 2018-06-28 DIAGNOSIS — S91311A Laceration without foreign body, right foot, initial encounter: Secondary | ICD-10-CM | POA: Diagnosis not present

## 2018-06-28 DIAGNOSIS — Z23 Encounter for immunization: Secondary | ICD-10-CM | POA: Diagnosis not present

## 2018-07-07 ENCOUNTER — Other Ambulatory Visit: Payer: Self-pay | Admitting: Gastroenterology

## 2018-07-31 NOTE — Anesthesia Preprocedure Evaluation (Addendum)
Anesthesia Evaluation  Patient identified by MRN, date of birth, ID band Patient awake    Reviewed: Allergy & Precautions, H&P , NPO status , Patient's Chart, lab work & pertinent test results, reviewed documented beta blocker date and time   Airway Mallampati: II  TM Distance: >3 FB Neck ROM: full    Dental no notable dental hx. (+) Dental Advisory Given   Pulmonary neg pulmonary ROS,    Pulmonary exam normal breath sounds clear to auscultation       Cardiovascular Exercise Tolerance: Good negative cardio ROS   Rhythm:regular Rate:Normal     Neuro/Psych negative neurological ROS  negative psych ROS   GI/Hepatic negative GI ROS, Neg liver ROS,   Endo/Other  negative endocrine ROS  Renal/GU negative Renal ROS  negative genitourinary   Musculoskeletal   Abdominal   Peds  Hematology negative hematology ROS (+)   Anesthesia Other Findings   Reproductive/Obstetrics negative OB ROS                           Anesthesia Physical Anesthesia Plan  ASA: II  Anesthesia Plan: MAC   Post-op Pain Management:    Induction: Intravenous  PONV Risk Score and Plan: 1 and Treatment may vary due to age or medical condition  Airway Management Planned: Mask and Natural Airway  Additional Equipment:   Intra-op Plan:   Post-operative Plan:   Informed Consent: I have reviewed the patients History and Physical, chart, labs and discussed the procedure including the risks, benefits and alternatives for the proposed anesthesia with the patient or authorized representative who has indicated his/her understanding and acceptance.   Dental Advisory Given  Plan Discussed with: CRNA, Anesthesiologist and Surgeon  Anesthesia Plan Comments:        Anesthesia Quick Evaluation

## 2018-08-01 ENCOUNTER — Encounter (HOSPITAL_COMMUNITY)
Admission: RE | Disposition: A | Discharge status: Home / Self Care | Payer: Self-pay | Source: Ambulatory Visit | Attending: Gastroenterology

## 2018-08-01 ENCOUNTER — Ambulatory Visit (HOSPITAL_COMMUNITY): Payer: Medicare Other | Admitting: Anesthesiology

## 2018-08-01 ENCOUNTER — Ambulatory Visit (HOSPITAL_COMMUNITY)
Admission: RE | Admit: 2018-08-01 | Discharge: 2018-08-01 | Disposition: A | Discharge status: Home / Self Care | Payer: Medicare Other | Source: Ambulatory Visit | Attending: Gastroenterology | Admitting: Gastroenterology

## 2018-08-01 ENCOUNTER — Encounter (HOSPITAL_COMMUNITY): Payer: Self-pay | Admitting: *Deleted

## 2018-08-01 ENCOUNTER — Other Ambulatory Visit: Payer: Self-pay

## 2018-08-01 DIAGNOSIS — D123 Benign neoplasm of transverse colon: Secondary | ICD-10-CM | POA: Diagnosis not present

## 2018-08-01 DIAGNOSIS — D120 Benign neoplasm of cecum: Secondary | ICD-10-CM | POA: Diagnosis not present

## 2018-08-01 DIAGNOSIS — Z86010 Personal history of colonic polyps: Secondary | ICD-10-CM | POA: Diagnosis not present

## 2018-08-01 DIAGNOSIS — Z882 Allergy status to sulfonamides status: Secondary | ICD-10-CM | POA: Diagnosis not present

## 2018-08-01 DIAGNOSIS — D124 Benign neoplasm of descending colon: Secondary | ICD-10-CM | POA: Diagnosis not present

## 2018-08-01 DIAGNOSIS — D122 Benign neoplasm of ascending colon: Secondary | ICD-10-CM | POA: Diagnosis not present

## 2018-08-01 DIAGNOSIS — K640 First degree hemorrhoids: Secondary | ICD-10-CM | POA: Insufficient documentation

## 2018-08-01 DIAGNOSIS — Z8719 Personal history of other diseases of the digestive system: Secondary | ICD-10-CM | POA: Insufficient documentation

## 2018-08-01 DIAGNOSIS — Z79899 Other long term (current) drug therapy: Secondary | ICD-10-CM | POA: Insufficient documentation

## 2018-08-01 HISTORY — PX: POLYPECTOMY: SHX5525

## 2018-08-01 HISTORY — PX: COLONOSCOPY WITH PROPOFOL: SHX5780

## 2018-08-01 SURGERY — COLONOSCOPY WITH PROPOFOL
Anesthesia: Monitor Anesthesia Care

## 2018-08-01 MED ORDER — LIDOCAINE 2% (20 MG/ML) 5 ML SYRINGE
INTRAMUSCULAR | Status: DC | PRN
  Administered 2018-08-01: 60 mg via INTRAVENOUS

## 2018-08-01 MED ORDER — SODIUM CHLORIDE 0.9 % IV SOLN: INTRAVENOUS | Status: DC

## 2018-08-01 MED ORDER — PROPOFOL 10 MG/ML IV BOLUS
INTRAVENOUS | Status: AC
  Filled 2018-08-01: qty 40

## 2018-08-01 MED ORDER — LACTATED RINGERS IV SOLN
INTRAVENOUS | Status: DC
  Administered 2018-08-01: 08:00:00 via INTRAVENOUS
  Administered 2018-08-01: 1000 mL via INTRAVENOUS

## 2018-08-01 MED ORDER — PROPOFOL 10 MG/ML IV BOLUS
INTRAVENOUS | Status: DC | PRN
  Administered 2018-08-01 (×8): 20 mg via INTRAVENOUS
  Administered 2018-08-01: 50 mg via INTRAVENOUS
  Administered 2018-08-01: 20 mg via INTRAVENOUS
  Administered 2018-08-01: 50 mg via INTRAVENOUS
  Administered 2018-08-01 (×18): 20 mg via INTRAVENOUS

## 2018-08-01 MED ORDER — PROPOFOL 10 MG/ML IV BOLUS
INTRAVENOUS | Status: AC
  Filled 2018-08-01: qty 20

## 2018-08-01 SURGICAL SUPPLY — 22 items

## 2018-08-01 NOTE — Anesthesia Postprocedure Evaluation (Signed)
Anesthesia Post Note  Patient: Carl Flores  Procedure(s) Performed: COLONOSCOPY WITH PROPOFOL (N/A ) POLYPECTOMY     Patient location during evaluation: PACU Anesthesia Type: MAC Level of consciousness: awake and alert Pain management: pain level controlled Vital Signs Assessment: post-procedure vital signs reviewed and stable Respiratory status: spontaneous breathing, nonlabored ventilation, respiratory function stable and patient connected to nasal cannula oxygen Cardiovascular status: stable and blood pressure returned to baseline Postop Assessment: no apparent nausea or vomiting Anesthetic complications: no    Last Vitals:  Vitals:   08/01/18 0850 08/01/18 0900  BP: 114/62 (!) 146/87  Pulse: 60 (!) 44  Resp: 15 19  Temp:    SpO2: 99% 99%    Last Pain:  Vitals:   08/01/18 0900  TempSrc:   PainSc: 0-No pain                 Willy Pinkerton

## 2018-08-01 NOTE — Anesthesia Procedure Notes (Signed)
Procedure Name: MAC Date/Time: 08/01/2018 7:32 AM Performed by: Cynda Familia, CRNA Pre-anesthesia Checklist: Patient identified, Emergency Drugs available, Suction available, Patient being monitored and Timeout performed Patient Re-evaluated:Patient Re-evaluated prior to induction Oxygen Delivery Method: Simple face mask Placement Confirmation: positive ETCO2 and breath sounds checked- equal and bilateral Dental Injury: Teeth and Oropharynx as per pre-operative assessment

## 2018-08-01 NOTE — H&P (Signed)
Subjective:   Patient is a 72 y.o. male presents with need for colonoscopy.  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  He had prior colon polyps and this was a 5-year surveillance procedure.  Despite multiple maneuvers we are unable to advance beyond the a sending colon due to very tortuous colon.  Arrangements were made to repeat the procedure at the hospital with the enteroscope on standby.. Procedure including risks and benefits discussed in office.  There are no active problems to display for this patient.  History reviewed. No pertinent past medical history.  History reviewed. No pertinent surgical history.  Medications Prior to Admission  Medication Sig Dispense Refill Last Dose  . Bioflavonoid Products (GRAPE SEED PO) Take 1 capsule by mouth daily. [place] Grape Seed Extract   07/31/2018 at Unknown time  . cetirizine (ZYRTEC) 10 MG tablet Take 10 mg by mouth 2 (two) times daily.    07/31/2018 at Unknown time  . Ginkgo Biloba 120 MG TABS Take 120 mg by mouth daily.   07/31/2018 at Unknown time  . Glucosamine HCl-MSM (GLUCOSAMINE-MSM PO) Take 1 tablet by mouth daily.   07/31/2018 at Unknown time  . hydroxychloroquine (PLAQUENIL) 200 MG tablet Take 200 mg by mouth 2 (two) times daily.   07/31/2018 at Unknown time  . Misc Natural Products (TART CHERRY ADVANCED PO) Take 1 capsule by mouth daily.   07/31/2018 at Unknown time  . Multiple Vitamin (MULTIVITAMIN WITH MINERALS) TABS tablet Take 1 tablet by mouth daily. One-A-Day for Men 50+   07/31/2018 at Unknown time  . Multiple Vitamins-Minerals (OCUVITE PO) Take 1 tablet by mouth daily.   07/31/2018 at Unknown time  . ranitidine (ZANTAC) 300 MG tablet Take 300 mg by mouth at bedtime.  3 07/31/2018 at Unknown time  . Specialty Vitamins Products (PROSTATE PO) Take 1 tablet by mouth daily. Prostate Plus Health Complex   07/31/2018 at Unknown time   Allergies  Allergen Reactions  . Erythromycin Hives  . Sulfa Antibiotics Hives    Social History    Tobacco Use  . Smoking status: Never Smoker  Substance Use Topics  . Alcohol use: Yes    History reviewed. No pertinent family history.   Objective:   Patient Vitals for the past 8 hrs:  BP Temp Temp src Pulse Resp SpO2 Height Weight  08/01/18 0653 (!) 141/75 98.2 F (36.8 C) Oral 66 17 94 % 6' (1.829 m) 89.4 kg   No intake/output data recorded. No intake/output data recorded.   See MD Preop evaluation      Assessment:   1.  History of colon polyps with incomplete colonoscopy 1 year ago  Plan:   We will proceed with colonoscopy and will start with the adult colonoscope with the small bowel enteroscope on standby.  This is been discussed in detail with the patient.

## 2018-08-01 NOTE — Discharge Instructions (Signed)
Colonoscopy, Adult, Care After This sheet gives you information about how to care for yourself after your procedure. Your doctor may also give you more specific instructions. If you have problems or questions, call your doctor. Follow these instructions at home: General instructions   For the first 24 hours after the procedure: ? Do not drive or use machinery. ? Do not sign important documents. ? Do not drink alcohol. ? Do your daily activities more slowly than normal. ? Eat foods that are soft and easy to digest. ? Rest often.  Take over-the-counter or prescription medicines only as told by your doctor.  It is up to you to get the results of your procedure. Ask your doctor, or the department performing the procedure, when your results will be ready. To help cramping and bloating:  Try walking around.  Put heat on your belly (abdomen) as told by your doctor. Use a heat source that your doctor recommends, such as a moist heat pack or a heating pad. ? Put a towel between your skin and the heat source. ? Leave the heat on for 20-30 minutes. ? Remove the heat if your skin turns bright red. This is especially important if you cannot feel pain, heat, or cold. You can get burned. Eating and drinking  Drink enough fluid to keep your pee (urine) clear or pale yellow.  Return to your normal diet as told by your doctor. Avoid heavy or fried foods that are hard to digest.  Avoid drinking alcohol for as long as told by your doctor. Contact a doctor if:  You have blood in your poop (stool) 2-3 days after the procedure. Get help right away if:  You have more than a small amount of blood in your poop.  You see large clumps of tissue (blood clots) in your poop.  Your belly is swollen.  You feel sick to your stomach (nauseous).  You throw up (vomit).  You have a fever.  You have belly pain that gets worse, and medicine does not help your pain. This information is not intended to  replace advice given to you by your health care provider. Make sure you discuss any questions you have with your health care provider. Document Released: 09/18/2010 Document Revised: 05/10/2016 Document Reviewed: 05/10/2016 Elsevier Interactive Patient Education  2017 [employer].   No aspirin, ibuprofen or other NSAID medications for 5 days. Office will send note or call when pathology results are obtained. Colonoscopy will be repeated based on the pathology results.

## 2018-08-01 NOTE — Transfer of Care (Signed)
Immediate Anesthesia Transfer of Care Note  Patient: Carl Flores  Procedure(s) Performed: COLONOSCOPY WITH PROPOFOL (N/A ) POLYPECTOMY  Patient Location: PACU and Endoscopy Unit  Anesthesia Type:MAC  Level of Consciousness: sedated  Airway & Oxygen Therapy: Patient Spontanous Breathing and Patient connected to face mask oxygen  Post-op Assessment: Report given to RN and Post -op Vital signs reviewed and stable  Post vital signs: Reviewed and stable  Last Vitals:  Vitals Value Taken Time  BP    Temp    Pulse    Resp    SpO2      Last Pain:  Vitals:   08/01/18 0653  TempSrc: Oral  PainSc: 0-No pain         Complications: No apparent anesthesia complications

## 2018-08-01 NOTE — Op Note (Signed)
[facility] Patient Name: Carl Flores Procedure Date: 08/01/2018 MRN: [id number] Attending MD: Nancy Fetter Dr., MD Date of Birth: 1946-06-11 CSN: [id number] Age: 72 Admit Type: Outpatient Procedure:                Colonoscopy Indications:              Last colonoscopy: December 2018. The patient had an                            extremely redundant tortuous colon and were able to                            arrive at the a sending colon but were not able to                            get down into the cecum. The procedure was repeated                            with the small bowel enteroscope on standby in case                            this is needed to reach the cecum. Providers:                Joyice Faster. Oletta Lamas Dr., MD, Elmer Ramp. Hinson, RN,                            Janie Billups, Technician, Glenis Smoker, CRNA Referring MD:              Medicines:                Monitored Anesthesia Care Complications:            No immediate complications. Estimated Blood Loss:     Estimated blood loss: none. Procedure:                Pre-Anesthesia Assessment:                           - Prior to the procedure, a History and Physical                            was performed, and patient medications and                            allergies were reviewed. The patient's tolerance of                            previous anesthesia was also reviewed. The risks                            and benefits of the procedure and the sedation                            options and risks were discussed with the patient.  All questions were answered, and informed consent                            was obtained. Prior Anticoagulants: The patient has                            taken no previous anticoagulant or antiplatelet                            agents. ASA Grade Assessment: II - A patient with                            mild systemic disease. After reviewing  the risks                            and benefits, the patient was deemed in                            satisfactory condition to undergo the procedure.                           After obtaining informed consent, the colonoscope                            was passed under direct vision. Throughout the                            procedure, the patient's blood pressure, pulse, and                            oxygen saturations were monitored continuously. The                            CF-HQ190L ([id number] Olympus adult colonoscope was                            introduced through the anus and advanced to the the                            cecum, identified by appendiceal orifice and                            ileocecal valve. The colonoscopy was technically                            difficult and complex due to a redundant colon,                            significant looping and a tortuous colon.                            Successful completion of the procedure was aided by  applying abdominal pressure. Multiple people were                            required to give abdominal pressure in order to                            arrive at the cecum The quality of the bowel                            preparation was good. The ileocecal valve,                            appendiceal orifice, and rectum were photographed. Scope In: 7:40:18 AM Scope Out: 8:33:07 AM Scope Withdrawal Time: 0 hours 33 minutes 6 seconds  Total Procedure Duration: 0 hours 52 minutes 49 seconds  Findings:      The perianal and digital rectal examinations were normal.      A 4 mm polyp was found in the distal transverse colon. The polyp was       sessile. The polyp was removed with a hot snare. Resection and retrieval       were complete. The pathology specimen was placed into Bottle Number 1.      A 5 mm polyp was found in the cecum. The polyp was sessile. The polyp       was removed with a hot  snare. Resection and retrieval were complete. The       pathology specimen was placed into Bottle Number 2.      A 3 mm polyp was found in the ascending colon. The polyp was sessile.       The polyp was removed with a hot snare. Polyp resection was incomplete,       and the resected tissue was not retrieved.      A 5 mm polyp was found in the descending colon. The polyp was sessile.       The polyp was removed with a hot snare. Resection and retrieval were       complete. The pathology specimen was placed into Bottle Number 3.      Non-bleeding internal hemorrhoids were found during retroflexion. The       hemorrhoids were medium-sized and Grade I (internal hemorrhoids that do       not prolapse). Impression:               - One 4 mm polyp in the distal transverse colon,                            removed with a hot snare. Resected and retrieved.                           - One 5 mm polyp in the cecum, removed with a hot                            snare. Resected and retrieved.                           - One 3 mm polyp in the ascending colon, removed  with a hot snare. Incomplete resection. Resected                            tissue not retrieved.                           - One 5 mm polyp in the descending colon, removed                            with a hot snare. Resected and retrieved.                           - Non-bleeding internal hemorrhoids. Moderate Sedation:      See anesthesia note, no moderate sedation. Recommendation:           - Patient has a contact number available for                            emergencies. The signs and symptoms of potential                            delayed complications were discussed with the                            patient. Return to normal activities tomorrow.                            Written discharge instructions were provided to the                            patient.                           - Resume previous  diet.                           - Continue present medications.                           - No aspirin, ibuprofen, naproxen, or other                            non-steroidal anti-inflammatory drugs for 5 days                            after polyp removal.                           - Repeat colonoscopy in 1 year for surveillance. Procedure Code(s):        --- Professional ---                           [phone], Colonoscopy, flexible; with removal of                            tumor(s), polyp(s), or other lesion(s) by snare  technique Diagnosis Code(s):        --- Professional ---                           D12.3, Benign neoplasm of transverse colon (hepatic                            flexure or splenic flexure)                           D12.0, Benign neoplasm of cecum                           D12.2, Benign neoplasm of ascending colon                           D12.4, Benign neoplasm of descending colon                           K64.0, First degree hemorrhoids CPT copyright 2018 [employer]. All rights reserved. The codes documented in this report are preliminary and upon coder review may  be revised to meet current compliance requirements. Nancy Fetter Dr., MD 08/01/2018 8:50:36 AM This report has been signed electronically. Number of Addenda: 0

## 2018-08-03 ENCOUNTER — Encounter (HOSPITAL_COMMUNITY): Payer: Self-pay | Admitting: Gastroenterology

## 2018-10-12 DIAGNOSIS — R972 Elevated prostate specific antigen [PSA]: Secondary | ICD-10-CM | POA: Diagnosis not present

## 2019-03-19 ENCOUNTER — Encounter (INDEPENDENT_AMBULATORY_CARE_PROVIDER_SITE_OTHER): Payer: Medicare Other | Admitting: Ophthalmology

## 2019-03-29 DIAGNOSIS — N401 Enlarged prostate with lower urinary tract symptoms: Secondary | ICD-10-CM | POA: Diagnosis not present

## 2019-04-24 DIAGNOSIS — R3915 Urgency of urination: Secondary | ICD-10-CM | POA: Diagnosis not present

## 2019-04-24 DIAGNOSIS — R972 Elevated prostate specific antigen [PSA]: Secondary | ICD-10-CM | POA: Diagnosis not present

## 2019-04-24 DIAGNOSIS — N401 Enlarged prostate with lower urinary tract symptoms: Secondary | ICD-10-CM | POA: Diagnosis not present

## 2019-04-25 DIAGNOSIS — L509 Urticaria, unspecified: Secondary | ICD-10-CM | POA: Diagnosis not present

## 2019-04-25 DIAGNOSIS — Z0000 Encounter for general adult medical examination without abnormal findings: Secondary | ICD-10-CM | POA: Diagnosis not present

## 2019-04-26 DIAGNOSIS — L509 Urticaria, unspecified: Secondary | ICD-10-CM | POA: Diagnosis not present

## 2019-05-12 DIAGNOSIS — Z23 Encounter for immunization: Secondary | ICD-10-CM | POA: Diagnosis not present

## 2019-08-13 DIAGNOSIS — Z1159 Encounter for screening for other viral diseases: Secondary | ICD-10-CM | POA: Diagnosis not present

## 2019-09-20 DIAGNOSIS — Z23 Encounter for immunization: Secondary | ICD-10-CM | POA: Diagnosis not present

## 2019-10-19 DIAGNOSIS — Z23 Encounter for immunization: Secondary | ICD-10-CM | POA: Diagnosis not present

## 2020-01-09 DIAGNOSIS — R972 Elevated prostate specific antigen [PSA]: Secondary | ICD-10-CM | POA: Diagnosis not present

## 2020-03-19 ENCOUNTER — Encounter (INDEPENDENT_AMBULATORY_CARE_PROVIDER_SITE_OTHER): Payer: Medicare Other | Admitting: Ophthalmology

## 2020-03-19 ENCOUNTER — Other Ambulatory Visit: Payer: Self-pay

## 2020-03-19 DIAGNOSIS — H35711 Central serous chorioretinopathy, right eye: Secondary | ICD-10-CM

## 2020-03-19 DIAGNOSIS — H43813 Vitreous degeneration, bilateral: Secondary | ICD-10-CM

## 2020-03-19 DIAGNOSIS — H353112 Nonexudative age-related macular degeneration, right eye, intermediate dry stage: Secondary | ICD-10-CM | POA: Diagnosis not present

## 2020-03-19 DIAGNOSIS — D3131 Benign neoplasm of right choroid: Secondary | ICD-10-CM | POA: Diagnosis not present

## 2020-03-19 DIAGNOSIS — M069 Rheumatoid arthritis, unspecified: Secondary | ICD-10-CM | POA: Diagnosis not present

## 2020-03-25 DIAGNOSIS — K635 Polyp of colon: Secondary | ICD-10-CM | POA: Diagnosis not present

## 2020-04-11 DIAGNOSIS — L509 Urticaria, unspecified: Secondary | ICD-10-CM | POA: Diagnosis not present

## 2020-04-11 DIAGNOSIS — L299 Pruritus, unspecified: Secondary | ICD-10-CM | POA: Diagnosis not present

## 2020-04-11 DIAGNOSIS — Z79899 Other long term (current) drug therapy: Secondary | ICD-10-CM | POA: Diagnosis not present

## 2020-04-11 DIAGNOSIS — L508 Other urticaria: Secondary | ICD-10-CM | POA: Diagnosis not present

## 2020-05-07 DIAGNOSIS — Z1159 Encounter for screening for other viral diseases: Secondary | ICD-10-CM | POA: Diagnosis not present

## 2020-05-08 DIAGNOSIS — Z86010 Personal history of colonic polyps: Secondary | ICD-10-CM | POA: Diagnosis not present

## 2020-05-08 DIAGNOSIS — D120 Benign neoplasm of cecum: Secondary | ICD-10-CM | POA: Diagnosis not present

## 2020-05-13 DIAGNOSIS — D120 Benign neoplasm of cecum: Secondary | ICD-10-CM | POA: Diagnosis not present

## 2020-05-19 DIAGNOSIS — R030 Elevated blood-pressure reading, without diagnosis of hypertension: Secondary | ICD-10-CM | POA: Diagnosis not present

## 2020-05-19 DIAGNOSIS — Z23 Encounter for immunization: Secondary | ICD-10-CM | POA: Diagnosis not present

## 2020-05-19 DIAGNOSIS — L03311 Cellulitis of abdominal wall: Secondary | ICD-10-CM | POA: Diagnosis not present

## 2020-05-21 ENCOUNTER — Other Ambulatory Visit: Payer: Self-pay | Admitting: Urology

## 2020-05-21 DIAGNOSIS — R972 Elevated prostate specific antigen [PSA]: Secondary | ICD-10-CM

## 2020-05-21 DIAGNOSIS — K4020 Bilateral inguinal hernia, without obstruction or gangrene, not specified as recurrent: Secondary | ICD-10-CM | POA: Diagnosis not present

## 2020-05-21 DIAGNOSIS — N400 Enlarged prostate without lower urinary tract symptoms: Secondary | ICD-10-CM | POA: Diagnosis not present

## 2020-06-03 DIAGNOSIS — Z0000 Encounter for general adult medical examination without abnormal findings: Secondary | ICD-10-CM | POA: Diagnosis not present

## 2020-06-13 DIAGNOSIS — Z23 Encounter for immunization: Secondary | ICD-10-CM | POA: Diagnosis not present

## 2020-06-14 ENCOUNTER — Other Ambulatory Visit: Payer: Self-pay

## 2020-06-14 ENCOUNTER — Ambulatory Visit
Admission: RE | Admit: 2020-06-14 | Discharge: 2020-06-14 | Disposition: A | Discharge status: Home / Self Care | Payer: Medicare Other | Source: Ambulatory Visit | Attending: Urology | Admitting: Urology

## 2020-06-14 DIAGNOSIS — R972 Elevated prostate specific antigen [PSA]: Secondary | ICD-10-CM | POA: Diagnosis not present

## 2020-06-14 DIAGNOSIS — R590 Localized enlarged lymph nodes: Secondary | ICD-10-CM | POA: Diagnosis not present

## 2020-06-14 DIAGNOSIS — R935 Abnormal findings on diagnostic imaging of other abdominal regions, including retroperitoneum: Secondary | ICD-10-CM | POA: Diagnosis not present

## 2020-06-14 DIAGNOSIS — N400 Enlarged prostate without lower urinary tract symptoms: Secondary | ICD-10-CM | POA: Diagnosis not present

## 2020-06-14 MED ORDER — GADOBENATE DIMEGLUMINE 529 MG/ML IV SOLN
18.0000 mL | Freq: Once | INTRAVENOUS | Status: AC | PRN
  Administered 2020-06-14: 18 mL via INTRAVENOUS

## 2020-07-02 DIAGNOSIS — C61 Malignant neoplasm of prostate: Secondary | ICD-10-CM | POA: Diagnosis not present

## 2020-07-02 DIAGNOSIS — R972 Elevated prostate specific antigen [PSA]: Secondary | ICD-10-CM | POA: Diagnosis not present

## 2020-07-10 ENCOUNTER — Other Ambulatory Visit (HOSPITAL_COMMUNITY): Payer: Self-pay | Admitting: Urology

## 2020-07-10 ENCOUNTER — Other Ambulatory Visit: Payer: Self-pay | Admitting: Urology

## 2020-07-10 DIAGNOSIS — C61 Malignant neoplasm of prostate: Secondary | ICD-10-CM

## 2020-07-14 DIAGNOSIS — C61 Malignant neoplasm of prostate: Secondary | ICD-10-CM | POA: Diagnosis not present

## 2020-07-21 ENCOUNTER — Encounter (HOSPITAL_COMMUNITY)
Admission: RE | Admit: 2020-07-21 | Discharge: 2020-07-21 | Disposition: A | Discharge status: Home / Self Care | Payer: Medicare Other | Source: Ambulatory Visit | Attending: Urology | Admitting: Urology

## 2020-07-21 ENCOUNTER — Other Ambulatory Visit: Payer: Self-pay

## 2020-07-21 DIAGNOSIS — C61 Malignant neoplasm of prostate: Secondary | ICD-10-CM | POA: Insufficient documentation

## 2020-07-21 MED ORDER — TECHNETIUM TC 99M MEDRONATE IV KIT
19.3000 | PACK | Freq: Once | INTRAVENOUS | Status: AC
  Administered 2020-07-21: 19.3 via INTRAVENOUS

## 2020-08-01 ENCOUNTER — Other Ambulatory Visit: Payer: Self-pay | Admitting: Urology

## 2020-08-01 DIAGNOSIS — C61 Malignant neoplasm of prostate: Secondary | ICD-10-CM

## 2020-08-07 DIAGNOSIS — C61 Malignant neoplasm of prostate: Secondary | ICD-10-CM | POA: Diagnosis not present

## 2020-08-07 DIAGNOSIS — M545 Low back pain, unspecified: Secondary | ICD-10-CM | POA: Diagnosis not present

## 2020-08-11 ENCOUNTER — Ambulatory Visit (HOSPITAL_COMMUNITY)
Admission: RE | Admit: 2020-08-11 | Discharge: 2020-08-11 | Disposition: A | Discharge status: Home / Self Care | Payer: Medicare Other | Source: Ambulatory Visit | Attending: Urology | Admitting: Urology

## 2020-08-11 ENCOUNTER — Other Ambulatory Visit: Payer: Self-pay

## 2020-08-11 DIAGNOSIS — M4319 Spondylolisthesis, multiple sites in spine: Secondary | ICD-10-CM | POA: Diagnosis not present

## 2020-08-11 DIAGNOSIS — M48061 Spinal stenosis, lumbar region without neurogenic claudication: Secondary | ICD-10-CM | POA: Diagnosis not present

## 2020-08-11 DIAGNOSIS — C61 Malignant neoplasm of prostate: Secondary | ICD-10-CM

## 2020-08-11 DIAGNOSIS — M5124 Other intervertebral disc displacement, thoracic region: Secondary | ICD-10-CM | POA: Diagnosis not present

## 2020-08-11 DIAGNOSIS — M4804 Spinal stenosis, thoracic region: Secondary | ICD-10-CM | POA: Diagnosis not present

## 2020-08-11 DIAGNOSIS — M47816 Spondylosis without myelopathy or radiculopathy, lumbar region: Secondary | ICD-10-CM | POA: Diagnosis not present

## 2020-08-11 MED ORDER — GADOBUTROL 1 MMOL/ML IV SOLN
8.0000 mL | Freq: Once | INTRAVENOUS | Status: AC | PRN
  Administered 2020-08-11: 8 mL via INTRAVENOUS

## 2020-08-19 DIAGNOSIS — M48062 Spinal stenosis, lumbar region with neurogenic claudication: Secondary | ICD-10-CM | POA: Diagnosis not present

## 2020-08-24 NOTE — Progress Notes (Signed)
Radiation Oncology         [phone] ________________________________  Initial outpatient Consultation  Name: Carl Flores MRN: [id number]  Date: 08/25/2020  DOB: Jan 14, 1946  UX:[id number], [name], MD  Franchot Gallo, MD   REFERRING PHYSICIAN: Franchot Gallo, MD  DIAGNOSIS: 74 y.o. gentleman with stage T1c adenocarcinoma of the prostate with a Gleason's score of 4+4 and a PSA of 7.9    ICD-10-CM   1. Malignant neoplasm of prostate ([place])  C61     HISTORY OF PRESENT ILLNESS::Carl Flores is a 74 y.o. gentleman who has followed with [facility] for over 10 years for overactive bladder.  During that time, the patient has had PSA checks ranging from 3.35 in 2010 up to 4.79 on March 30, 2019.  When Dr. Karsten Ro retired, the patient's care was assumed by Dr. Diona Fanti.  His PSA increased to 7.90 on April 17, 2020.  Given the significant increase, the patient underwent prostate MRI on June 14, 2020.  The MRI was notable for a PI-RADS category 4 lesion of the right posterior lateral and posterior medial peripheral zone in the base and mid gland measuring 2.4 cm.  The patient proceeded to MRI fusion transrectal ultrasound with 12 biopsies of the prostate and 2 biopsies directed at the region of interest on July 02, 2020.  The prostate volume measured 59.3 cc.  Out of 14 core biopsies, 3 were positive.  The maximum Gleason score was 4+4 in the right lateral base with Gleason 4+3 seen in the right base and right lateral mid.  The targeted biopsies at the region of interest contained benign prostate tissue.  The patient underwent bone scan and CT showing some activity in the spine of uncertain significance.  Follow-up MRIs of the thoracic and lumbar spine confirmed that the abnormal findings from bone scan reflected degenerative changes.  There was no evidence of any metastatic disease.  The patient reviewed the biopsy results with his urologist and he has kindly been referred  today for discussion of potential radiation treatment options.  PREVIOUS RADIATION THERAPY: No  PAST MEDICAL HISTORY:  has a past medical history of Prostate cancer ([place]).    PAST SURGICAL HISTORY: Past Surgical History:  Procedure Laterality Date  . COLONOSCOPY WITH PROPOFOL N/A 08/01/2018   Procedure: COLONOSCOPY WITH PROPOFOL;  Surgeon: Laurence Spates, MD;  Location: [facility];  Service: Endoscopy;  Laterality: N/A;  . POLYPECTOMY  08/01/2018   Procedure: POLYPECTOMY;  Surgeon: Laurence Spates, MD;  Location: [facility];  Service: Endoscopy;;  . PROSTATE BIOPSY      FAMILY HISTORY: family history includes Breast cancer in his daughter; Colon cancer in his cousin, cousin, cousin, and mother.  SOCIAL HISTORY:  reports that he has never smoked. He has never used smokeless tobacco. He reports current alcohol use. He reports that he does not use drugs.  ALLERGIES: Cephalexin, Erythromycin, Prednisone, and Sulfa antibiotics  MEDICATIONS:  Current Outpatient Medications  Medication Sig Dispense Refill  . acetaminophen (TYLENOL) 325 MG tablet Take 650 mg by mouth every 6 (six) hours as needed.    Marland Kitchen aspirin 325 MG tablet 1/4 tablet    . Bioflavonoid Products (GRAPE SEED PO) Take 1 capsule by mouth daily. [place] Grape Seed Extract    . Coenzyme Q10 (CO Q-10) 100 MG CAPS See admin instructions.    . Ginkgo Biloba 120 MG TABS Take 120 mg by mouth daily.    . Glucosamine HCl-MSM (GLUCOSAMINE-MSM PO) Take 1 tablet by mouth daily.    Marland Kitchen  Glucosamine-Chondroit-Vit C-Mn (GLUCOSAMINE 1500 COMPLEX) CAPS 1 capsule    . hydroxychloroquine (PLAQUENIL) 200 MG tablet Take 200 mg by mouth 2 (two) times daily.    . Levocetirizine Dihydrochloride (XYZAL PO) Xyzal    . Misc Natural Products (PROSTATE HEALTH PO) 1 capsule    . Misc Natural Products (TART CHERRY ADVANCED PO) Take 1 capsule by mouth daily.    . Multiple Vitamin (MULTIVITAMIN WITH MINERALS) TABS tablet Take 1 tablet by mouth daily.  One-A-Day for Men 50+    . Multiple Vitamins-Minerals (OCUVITE PO) Take 1 tablet by mouth daily.    . traMADol (ULTRAM) 50 MG tablet      No current facility-administered medications for this encounter.    REVIEW OF SYSTEMS:  A 15 point review of systems is documented in the electronic medical record. This was obtained by the nursing staff. However, I reviewed this with the patient to discuss relevant findings and make appropriate changes.  Pertinent items noted in HPI and remainder of comprehensive ROS otherwise negative..  The patient completed an IPSS and IIEF questionnaire.  His IPSS score was 13 indicating moderate urinary outflow obstructive symptoms.  He indicated that his erectile function is 15.   PHYSICAL EXAM: This patient is in no acute distress.  He is alert and oriented.   height is 6' (1.829 m) and weight is 191 lb 8 oz (86.9 kg). His temporal temperature is 97.2 F (36.2 C) (abnormal). His blood pressure is 159/88 (abnormal) and his pulse is 75. His respiration is 18 and oxygen saturation is 100%.  He exhibits no respiratory distress or labored breathing.  He appears neurologically intact.  His mood is pleasant.  His affect is appropriate.  Please note the digital rectal exam findings described above.  KPS = 100  100 - Normal; no complaints; no evidence of disease. 90   - Able to carry on normal activity; minor signs or symptoms of disease. 80   - Normal activity with effort; some signs or symptoms of disease. 12   - Cares for self; unable to carry on normal activity or to do active work. 60   - Requires occasional assistance, but is able to care for most of his personal needs. 50   - Requires considerable assistance and frequent medical care. 51   - Disabled; requires special care and assistance. 37   - Severely disabled; hospital admission is indicated although death not imminent. 19   - Very sick; hospital admission necessary; active supportive treatment necessary. 10   -  Moribund; fatal processes progressing rapidly. 0     - Dead  Karnofsky DA, Abelmann [place], Craver LS and Burchenal [facility] [phone]) The use of the nitrogen mustards in the palliative treatment of carcinoma: with particular reference to bronchogenic carcinoma Cancer 1 [phone]   LABORATORY DATA:  Lab Results  Component Value Date   WBC 6.1 04/15/2013   HGB 15.1 04/15/2013   HCT 44.3 04/15/2013   MCV 89.7 04/15/2013   PLT 166 04/15/2013   Lab Results  Component Value Date   NA 136 04/15/2013   K 4.4 04/15/2013   CL 101 04/15/2013   CO2 26 04/15/2013   Lab Results  Component Value Date   ALT 22 04/15/2013   AST 32 04/15/2013   ALKPHOS 77 04/15/2013   BILITOT 0.4 04/15/2013     RADIOGRAPHY: MR THORACIC SPINE W WO CONTRAST  Result Date: 08/11/2020 CLINICAL DATA:  Prostate cancer with rising PSA. Abnormal bone scan. Rule out metastatic  disease. EXAM: MRI THORACIC AND LUMBAR SPINE WITHOUT AND WITH CONTRAST TECHNIQUE: Multiplanar and multiecho pulse sequences of the thoracic and lumbar spine were obtained without and with intravenous contrast. CONTRAST:  52mL GADAVIST GADOBUTROL 1 MMOL/ML IV SOLN COMPARISON:  Whole body bone scan 07/21/2020 FINDINGS: MRI THORACIC SPINE FINDINGS Alignment: 4 mm anterolisthesis C7-T1 related to facet and disc degeneration. 2 mm anterolisthesis T2-3. Remaining alignment normal. Mild dextroscoliosis. Vertebrae: Negative for fracture. No lesions are seen suspicious for metastatic disease. Disc degeneration and spurring asymmetric on the left at T7-8 corresponds to bone scan activity. No enhancing bone marrow lesion identified. Cord:  Normal signal and morphology. Paraspinal and other soft tissues: Negative for paraspinous mass, adenopathy, or fluid collection. Disc levels: C7-T1: 4 mm anterolisthesis. Extensive disc and facet degeneration. Moderate spinal and foraminal stenosis bilaterally. T1-2: Mild disc degeneration T2-3: 2 mm anterolisthesis. Bilateral facet hypertrophy.  Mild foraminal stenosis bilaterally. T3-4: Negative T4-5: Negative T5-6: Negative T6-7: Negative T7-8: Disc degeneration with disc space narrowing and spurring, asymmetric on the left. Mild facet degeneration. Mild spinal stenosis. T8-9: Small right-sided disc protrusion without significant stenosis T9-10: Diffuse bulging of the disc and mild facet degeneration. Moderate right foraminal stenosis and mild left foraminal stenosis. T10-11: Bilateral facet degeneration.  Negative for stenosis T11-12: Bilateral facet degeneration. Mild foraminal stenosis bilaterally T12-L1: Mild facet degeneration.  Negative for stenosis. MRI LUMBAR SPINE FINDINGS Segmentation:  Normal.  Lowest disc space L5-S1 Alignment:  Mild retrolisthesis L2-3.  Mild anterolisthesis L4-5. Vertebrae: Negative for fracture or mass. No enhancing bone marrow lesion identified. Conus medullaris: Extends to the L1-2 level and appears normal. Paraspinal and other soft tissues: Negative for paraspinous mass or adenopathy. Disc levels: L1-2: Shallow central disc protrusion and mild facet degeneration. Negative for stenosis L2-3: Disc bulging and endplate spurring with mild retrolisthesis. Small left foraminal disc protrusion. Bilateral mild facet degeneration. Mild spinal stenosis. Moderate subarticular and foraminal stenosis on the left. Mild subarticular stenosis on the right. L3-4: Disc degeneration with disc bulging and mild to moderate central disc protrusion. Moderate facet and ligamentum flavum hypertrophy. Severe spinal stenosis. Moderate to severe subarticular stenosis bilaterally L4-5: Disc degeneration with small central disc protrusion. Severe facet degeneration bilaterally. 8 mm synovial cyst projecting posterior to the facet joint on the left. Moderate to severe spinal stenosis. Moderate subarticular stenosis bilaterally L5-S1: Shallow central disc protrusion. Mild facet degeneration. Negative for stenosis. IMPRESSION: 1. No lesions suggestive  of metastatic disease in the thoracic or lumbar spine. 2. Bone scan findings consistent with degenerative change in the thoracic and lumbar spine. 3. Multilevel stenosis in the lumbar spine. Severe spinal stenosis L3-4 and moderate to severe spinal stenosis L4-5 due to disc and facet degeneration. Electronically Signed   By: Franchot Gallo M.D.   On: 08/11/2020 09:07   MR Lumbar Spine W Wo Contrast  Result Date: 08/11/2020 CLINICAL DATA:  Prostate cancer with rising PSA. Abnormal bone scan. Rule out metastatic disease. EXAM: MRI THORACIC AND LUMBAR SPINE WITHOUT AND WITH CONTRAST TECHNIQUE: Multiplanar and multiecho pulse sequences of the thoracic and lumbar spine were obtained without and with intravenous contrast. CONTRAST:  36mL GADAVIST GADOBUTROL 1 MMOL/ML IV SOLN COMPARISON:  Whole body bone scan 07/21/2020 FINDINGS: MRI THORACIC SPINE FINDINGS Alignment: 4 mm anterolisthesis C7-T1 related to facet and disc degeneration. 2 mm anterolisthesis T2-3. Remaining alignment normal. Mild dextroscoliosis. Vertebrae: Negative for fracture. No lesions are seen suspicious for metastatic disease. Disc degeneration and spurring asymmetric on the left at T7-8 corresponds to bone scan  activity. No enhancing bone marrow lesion identified. Cord:  Normal signal and morphology. Paraspinal and other soft tissues: Negative for paraspinous mass, adenopathy, or fluid collection. Disc levels: C7-T1: 4 mm anterolisthesis. Extensive disc and facet degeneration. Moderate spinal and foraminal stenosis bilaterally. T1-2: Mild disc degeneration T2-3: 2 mm anterolisthesis. Bilateral facet hypertrophy. Mild foraminal stenosis bilaterally. T3-4: Negative T4-5: Negative T5-6: Negative T6-7: Negative T7-8: Disc degeneration with disc space narrowing and spurring, asymmetric on the left. Mild facet degeneration. Mild spinal stenosis. T8-9: Small right-sided disc protrusion without significant stenosis T9-10: Diffuse bulging of the disc and  mild facet degeneration. Moderate right foraminal stenosis and mild left foraminal stenosis. T10-11: Bilateral facet degeneration.  Negative for stenosis T11-12: Bilateral facet degeneration. Mild foraminal stenosis bilaterally T12-L1: Mild facet degeneration.  Negative for stenosis. MRI LUMBAR SPINE FINDINGS Segmentation:  Normal.  Lowest disc space L5-S1 Alignment:  Mild retrolisthesis L2-3.  Mild anterolisthesis L4-5. Vertebrae: Negative for fracture or mass. No enhancing bone marrow lesion identified. Conus medullaris: Extends to the L1-2 level and appears normal. Paraspinal and other soft tissues: Negative for paraspinous mass or adenopathy. Disc levels: L1-2: Shallow central disc protrusion and mild facet degeneration. Negative for stenosis L2-3: Disc bulging and endplate spurring with mild retrolisthesis. Small left foraminal disc protrusion. Bilateral mild facet degeneration. Mild spinal stenosis. Moderate subarticular and foraminal stenosis on the left. Mild subarticular stenosis on the right. L3-4: Disc degeneration with disc bulging and mild to moderate central disc protrusion. Moderate facet and ligamentum flavum hypertrophy. Severe spinal stenosis. Moderate to severe subarticular stenosis bilaterally L4-5: Disc degeneration with small central disc protrusion. Severe facet degeneration bilaterally. 8 mm synovial cyst projecting posterior to the facet joint on the left. Moderate to severe spinal stenosis. Moderate subarticular stenosis bilaterally L5-S1: Shallow central disc protrusion. Mild facet degeneration. Negative for stenosis. IMPRESSION: 1. No lesions suggestive of metastatic disease in the thoracic or lumbar spine. 2. Bone scan findings consistent with degenerative change in the thoracic and lumbar spine. 3. Multilevel stenosis in the lumbar spine. Severe spinal stenosis L3-4 and moderate to severe spinal stenosis L4-5 due to disc and facet degeneration. Electronically Signed   By: Franchot Gallo  M.D.   On: 08/11/2020 09:07      IMPRESSION: This gentleman is a 74 y.o. gentleman with stage T1c adenocarcinoma of the prostate with a Gleason's score of 4+4 and a PSA of 7.9.  His T-Stage, Gleason's Score, and PSA put him into the high risk group.  Accordingly he is eligible for a variety of potential treatment options including long term androgen deprivation therapy (LT-ADT) and radiotherapy with or without up-front seed implant boost.  PLAN:Today I reviewed the findings and workup thus far.  We discussed the natural history of prostate cancer.  We reviewed the the implications of T-stage, Gleason's Score, and PSA on decision-making and outcomes in prostate cancer.  We discussed radiation treatment in the management of prostate cancer with regard to the logistics and delivery of external beam radiation treatment as well as the logistics and delivery of prostate brachytherapy.  We compared and contrasted each of these approaches and also compared these against prostatectomy.  The patient expressed interest in prostate brachytherapy boost.   The patient would like to proceed with LT-ADT to start now, then prostate brachytherapy boost and subsequent IMRT.  I will share my findings with Dr. Diona Fanti and move forward with scheduling the procedure in the near future.     I personally spent 70 minutes in this encounter  including chart review, reviewing radiological studies, meeting face-to-face with the patient, entering orders and completing documentation.    ------------------------------------------------  Sheral Apley. Tammi Klippel, M.D.

## 2020-08-25 ENCOUNTER — Encounter: Payer: Self-pay | Admitting: Medical Oncology

## 2020-08-25 ENCOUNTER — Other Ambulatory Visit: Payer: Self-pay

## 2020-08-25 ENCOUNTER — Ambulatory Visit
Admission: RE | Admit: 2020-08-25 | Discharge: 2020-08-25 | Disposition: A | Discharge status: Home / Self Care | Payer: Medicare Other | Source: Ambulatory Visit | Attending: Radiation Oncology | Admitting: Radiation Oncology

## 2020-08-25 ENCOUNTER — Encounter: Payer: Self-pay | Admitting: Radiation Oncology

## 2020-08-25 VITALS — BP 159/88 | HR 75 | Temp 97.2°F | Resp 18 | Ht 72.0 in | Wt 191.5 lb

## 2020-08-25 DIAGNOSIS — C61 Malignant neoplasm of prostate: Secondary | ICD-10-CM | POA: Diagnosis not present

## 2020-08-25 DIAGNOSIS — K4090 Unilateral inguinal hernia, without obstruction or gangrene, not specified as recurrent: Secondary | ICD-10-CM | POA: Insufficient documentation

## 2020-08-25 DIAGNOSIS — Z79899 Other long term (current) drug therapy: Secondary | ICD-10-CM | POA: Insufficient documentation

## 2020-08-25 DIAGNOSIS — Z803 Family history of malignant neoplasm of breast: Secondary | ICD-10-CM | POA: Insufficient documentation

## 2020-08-25 DIAGNOSIS — Z7982 Long term (current) use of aspirin: Secondary | ICD-10-CM | POA: Insufficient documentation

## 2020-08-25 DIAGNOSIS — M25519 Pain in unspecified shoulder: Secondary | ICD-10-CM | POA: Insufficient documentation

## 2020-08-25 DIAGNOSIS — Z808 Family history of malignant neoplasm of other organs or systems: Secondary | ICD-10-CM | POA: Diagnosis not present

## 2020-08-25 DIAGNOSIS — Z860100 Personal history of colon polyps, unspecified: Secondary | ICD-10-CM | POA: Insufficient documentation

## 2020-08-25 DIAGNOSIS — M47814 Spondylosis without myelopathy or radiculopathy, thoracic region: Secondary | ICD-10-CM | POA: Insufficient documentation

## 2020-08-25 DIAGNOSIS — M47816 Spondylosis without myelopathy or radiculopathy, lumbar region: Secondary | ICD-10-CM | POA: Diagnosis not present

## 2020-08-25 DIAGNOSIS — I700 Atherosclerosis of aorta: Secondary | ICD-10-CM | POA: Insufficient documentation

## 2020-08-25 DIAGNOSIS — Z800 Family history of malignant neoplasm of digestive organs: Secondary | ICD-10-CM | POA: Insufficient documentation

## 2020-08-25 DIAGNOSIS — M2041 Other hammer toe(s) (acquired), right foot: Secondary | ICD-10-CM | POA: Insufficient documentation

## 2020-08-25 DIAGNOSIS — M5136 Other intervertebral disc degeneration, lumbar region: Secondary | ICD-10-CM | POA: Insufficient documentation

## 2020-08-25 DIAGNOSIS — B351 Tinea unguium: Secondary | ICD-10-CM | POA: Insufficient documentation

## 2020-08-25 HISTORY — DX: Malignant neoplasm of prostate: C61

## 2020-08-25 NOTE — Progress Notes (Signed)
Introduced myself to patient and his wife as the prostate oncology nurse navigator. He states he  has a strong family history of cancer, including his daughter. He is very interested in genetic testing. No barriers to care identified at this time. I gave them my business card and asked him to call me if I can answer questions or assist him in any way. He voiced understanding.

## 2020-08-25 NOTE — Progress Notes (Addendum)
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 Prostate volume: 59.3 cc.  Carl Flores has been followed by [facility] for overactive bladder x 10 years. Rising PSA noted thus biopsy done.   2021  psa  7.90 2020  psa  4.79 2010  psa  3.35    Biopsies of prostate (if applicable) revealed:    Past/Anticipated interventions by urology, if any: prostate biopsy, CT and bone scan (insignificant findings), referral to Dr. Kathrynn Running.  Past/Anticipated interventions by medical oncology, if any: no  Weight changes, if any: denies  Bowel/Bladder complaints, if any: IPSS 13. SHIM 15. Denies dysuria or hematuria. Reports occasional urinary leakage related to urgency. Denies need for pads or depend yet. Reports urinary urgency to be his biggest complaint. Reports intermittent episodes of diarrhea. Patient explains that he relates the diarrhea to loss of nerve sensation from spinal stenosis.   Nausea/Vomiting, if any: denies  Pain issues, if any:  Reports chronic back, bilateral hip and leg pain related to spinal stenosis. Reports ambulation increases pain. Reports alternating between tylenol and tramadol to manage pain. Reports exercise helped with pain in the past but no longer does. Reports pain has increased over the last 3 months.  SAFETY ISSUES:  Prior radiation? denies  Pacemaker/ICD? denies  Possible current pregnancy? no, male patient  Is the patient on methotrexate? denies  Current Complaints / other details:  74 year old male. Married with one daughter. Resides in [place].

## 2020-08-26 DIAGNOSIS — C61 Malignant neoplasm of prostate: Secondary | ICD-10-CM | POA: Diagnosis not present

## 2020-08-26 DIAGNOSIS — M48062 Spinal stenosis, lumbar region with neurogenic claudication: Secondary | ICD-10-CM | POA: Diagnosis not present

## 2020-08-26 DIAGNOSIS — Z8669 Personal history of other diseases of the nervous system and sense organs: Secondary | ICD-10-CM | POA: Diagnosis not present

## 2020-08-27 DIAGNOSIS — C61 Malignant neoplasm of prostate: Secondary | ICD-10-CM | POA: Insufficient documentation

## 2020-09-04 DIAGNOSIS — M4800 Spinal stenosis, site unspecified: Secondary | ICD-10-CM | POA: Diagnosis not present

## 2020-09-04 DIAGNOSIS — C61 Malignant neoplasm of prostate: Secondary | ICD-10-CM | POA: Diagnosis not present

## 2020-09-26 DIAGNOSIS — C61 Malignant neoplasm of prostate: Secondary | ICD-10-CM | POA: Diagnosis not present

## 2020-10-06 DIAGNOSIS — N5201 Erectile dysfunction due to arterial insufficiency: Secondary | ICD-10-CM | POA: Diagnosis not present

## 2020-10-06 DIAGNOSIS — C61 Malignant neoplasm of prostate: Secondary | ICD-10-CM | POA: Diagnosis not present

## 2020-10-10 DIAGNOSIS — Z87891 Personal history of nicotine dependence: Secondary | ICD-10-CM | POA: Diagnosis not present

## 2020-10-10 DIAGNOSIS — M48061 Spinal stenosis, lumbar region without neurogenic claudication: Secondary | ICD-10-CM | POA: Diagnosis not present

## 2020-10-10 DIAGNOSIS — I700 Atherosclerosis of aorta: Secondary | ICD-10-CM | POA: Diagnosis not present

## 2020-10-10 DIAGNOSIS — Z882 Allergy status to sulfonamides status: Secondary | ICD-10-CM | POA: Diagnosis not present

## 2020-10-10 DIAGNOSIS — M47819 Spondylosis without myelopathy or radiculopathy, site unspecified: Secondary | ICD-10-CM | POA: Diagnosis not present

## 2020-10-10 DIAGNOSIS — Z7982 Long term (current) use of aspirin: Secondary | ICD-10-CM | POA: Diagnosis not present

## 2020-10-10 DIAGNOSIS — Z881 Allergy status to other antibiotic agents status: Secondary | ICD-10-CM | POA: Diagnosis not present

## 2020-10-10 DIAGNOSIS — Z85828 Personal history of other malignant neoplasm of skin: Secondary | ICD-10-CM | POA: Diagnosis not present

## 2020-10-10 DIAGNOSIS — Z888 Allergy status to other drugs, medicaments and biological substances status: Secondary | ICD-10-CM | POA: Diagnosis not present

## 2020-10-10 DIAGNOSIS — C61 Malignant neoplasm of prostate: Secondary | ICD-10-CM | POA: Diagnosis not present

## 2020-10-10 DIAGNOSIS — M4313 Spondylolisthesis, cervicothoracic region: Secondary | ICD-10-CM | POA: Diagnosis not present

## 2020-10-20 ENCOUNTER — Encounter: Payer: Self-pay | Admitting: Medical Oncology

## 2020-10-20 DIAGNOSIS — M4120 Other idiopathic scoliosis, site unspecified: Secondary | ICD-10-CM | POA: Diagnosis not present

## 2020-10-20 DIAGNOSIS — G8929 Other chronic pain: Secondary | ICD-10-CM | POA: Diagnosis not present

## 2020-10-20 DIAGNOSIS — M5442 Lumbago with sciatica, left side: Secondary | ICD-10-CM | POA: Diagnosis not present

## 2020-10-20 DIAGNOSIS — R030 Elevated blood-pressure reading, without diagnosis of hypertension: Secondary | ICD-10-CM | POA: Diagnosis not present

## 2020-10-20 DIAGNOSIS — M5416 Radiculopathy, lumbar region: Secondary | ICD-10-CM | POA: Diagnosis not present

## 2020-10-21 ENCOUNTER — Telehealth: Payer: Self-pay | Admitting: *Deleted

## 2020-10-21 ENCOUNTER — Encounter: Payer: Self-pay | Admitting: Medical Oncology

## 2020-10-21 NOTE — Telephone Encounter (Signed)
Called patient to inform of ADT appt. on 10-22-20 - arrival time- 1:15 pm @ Dr. Alan Ripper Office, lvm for a return call

## 2020-10-21 NOTE — Progress Notes (Signed)
Left message to let patient know I received his message, that he would like to proceed with ADT and radiation. I informed him that our office will coordinate the hormone injection with Dr. Alan Ripper office. He should expect a call from [facility] with this appointment.

## 2020-10-22 DIAGNOSIS — C61 Malignant neoplasm of prostate: Secondary | ICD-10-CM | POA: Diagnosis not present

## 2020-10-22 DIAGNOSIS — Z5111 Encounter for antineoplastic chemotherapy: Secondary | ICD-10-CM | POA: Diagnosis not present

## 2020-10-23 ENCOUNTER — Encounter: Payer: Self-pay | Admitting: Medical Oncology

## 2020-10-23 DIAGNOSIS — Z5111 Encounter for antineoplastic chemotherapy: Secondary | ICD-10-CM | POA: Diagnosis not present

## 2020-10-23 DIAGNOSIS — C61 Malignant neoplasm of prostate: Secondary | ICD-10-CM | POA: Diagnosis not present

## 2020-11-04 ENCOUNTER — Telehealth: Payer: Self-pay | Admitting: *Deleted

## 2020-11-04 NOTE — Telephone Encounter (Signed)
CALLED PATIENT TO ASK IF HE HAS MADE A DECISION TO HAVE IMPLANT, LVM FOR A RETURN CALL

## 2020-11-05 ENCOUNTER — Telehealth: Payer: Self-pay | Admitting: *Deleted

## 2020-11-05 DIAGNOSIS — M545 Low back pain, unspecified: Secondary | ICD-10-CM | POA: Diagnosis not present

## 2020-11-05 NOTE — Telephone Encounter (Signed)
RETURNED PATIENT'S PHONE CALL, LVM FOR A RETURN CALL 

## 2020-11-07 DIAGNOSIS — C61 Malignant neoplasm of prostate: Secondary | ICD-10-CM | POA: Diagnosis not present

## 2020-11-10 ENCOUNTER — Encounter: Payer: Self-pay | Admitting: Medical Oncology

## 2020-11-17 ENCOUNTER — Ambulatory Visit: Payer: BLUE CROSS/BLUE SHIELD | Admitting: Radiation Oncology

## 2020-11-18 ENCOUNTER — Telehealth: Payer: Self-pay | Admitting: *Deleted

## 2020-11-18 NOTE — Telephone Encounter (Signed)
CALLED PATIENT TO INFORM OF PRE-SEED APPTS. FOR 11-21-20-ARRIVAL TIME- 10:45 AM @ [facility], SPOKE WITH PATIENT AND HE IS AWARE OF THESE APPTS.

## 2020-11-20 ENCOUNTER — Telehealth: Payer: Self-pay | Admitting: *Deleted

## 2020-11-20 NOTE — Progress Notes (Addendum)
  Radiation Oncology         [phone] ________________________________  Name: Carl Flores MRN: [id number]  Date: 11/21/2020  DOB: 02/07/1946  SIMULATION AND TREATMENT PLANNING NOTE PUBIC ARCH STUDY  CJ:[id number], [name], MD  Franchot Gallo, MD  DIAGNOSIS: 75 y.o. gentleman with stage T1c adenocarcinoma of the prostate with a Gleason's score of 4+4 and a PSA of 7.9  Oncology History   No history exists.      ICD-10-CM   1. Malignant neoplasm of prostate ([place])  C61     COMPLEX SIMULATION:  The patient presented today for evaluation for possible prostate seed implant. He was brought to the radiation planning suite and placed supine on the CT couch. A 3-dimensional image study set was obtained in upload to the planning computer. There, on each axial slice, I contoured the prostate gland. Then, using three-dimensional radiation planning tools I reconstructed the prostate in view of the structures from the transperineal needle pathway to assess for possible pubic arch interference. In doing so, I did not appreciate any pubic arch interference. Also, the patient's prostate volume was estimated based on the drawn structure. The volume was 52 cc.  Given the pubic arch appearance and prostate volume, patient remains a good candidate to proceed with prostate seed implant. Today, he freely provided informed written consent to proceed.    PLAN: The patient will undergo prostate seed implant boost to be followed by IMRT.   ________________________________  Sheral Apley. Tammi Klippel, M.D.

## 2020-11-20 NOTE — Telephone Encounter (Signed)
Called patient to remind of pre-seed appts. for 11-21-20, spoke with patient and he is aware of these appts.

## 2020-11-21 ENCOUNTER — Other Ambulatory Visit: Payer: Self-pay

## 2020-11-21 ENCOUNTER — Ambulatory Visit
Admission: RE | Admit: 2020-11-21 | Discharge: 2020-11-21 | Disposition: A | Discharge status: Home / Self Care | Payer: Medicare Other | Source: Ambulatory Visit | Attending: Radiation Oncology | Admitting: Radiation Oncology

## 2020-11-21 ENCOUNTER — Ambulatory Visit
Admission: RE | Admit: 2020-11-21 | Discharge: 2020-11-21 | Disposition: A | Discharge status: Home / Self Care | Payer: Medicare Other | Source: Ambulatory Visit | Attending: Urology | Admitting: Urology

## 2020-11-21 DIAGNOSIS — Z5111 Encounter for antineoplastic chemotherapy: Secondary | ICD-10-CM | POA: Diagnosis not present

## 2020-11-21 DIAGNOSIS — C61 Malignant neoplasm of prostate: Secondary | ICD-10-CM

## 2020-11-28 ENCOUNTER — Other Ambulatory Visit: Payer: Self-pay | Admitting: Urology

## 2020-11-28 ENCOUNTER — Telehealth: Payer: Self-pay | Admitting: *Deleted

## 2020-11-28 NOTE — Telephone Encounter (Signed)
CALLED PATIENT TO INFORM OF IMPLANT DATE, LVM FOR A RETURN CALL 

## 2020-12-02 ENCOUNTER — Telehealth: Payer: Self-pay | Admitting: *Deleted

## 2020-12-02 NOTE — Telephone Encounter (Signed)
CALLED PATIENT TO INFORM OF APPT. FOR CHEST X-RAY AND EKG FOR 12-03-20- ARRIVAL TIME- 1:45 PM @ [facility] ADMITTING, LVM FOR A RETURN CALL

## 2020-12-03 ENCOUNTER — Ambulatory Visit (HOSPITAL_COMMUNITY)
Admission: RE | Admit: 2020-12-03 | Discharge: 2020-12-03 | Disposition: A | Discharge status: Home / Self Care | Payer: Medicare Other | Source: Ambulatory Visit | Attending: Urology | Admitting: Urology

## 2020-12-03 ENCOUNTER — Encounter (HOSPITAL_COMMUNITY)
Admission: RE | Admit: 2020-12-03 | Discharge: 2020-12-03 | Disposition: A | Discharge status: Home / Self Care | Payer: Medicare Other | Source: Ambulatory Visit | Attending: Urology | Admitting: Urology

## 2020-12-03 ENCOUNTER — Telehealth: Payer: Self-pay | Admitting: *Deleted

## 2020-12-03 ENCOUNTER — Other Ambulatory Visit: Payer: Self-pay

## 2020-12-03 DIAGNOSIS — C61 Malignant neoplasm of prostate: Secondary | ICD-10-CM | POA: Diagnosis not present

## 2020-12-03 DIAGNOSIS — Z01818 Encounter for other preprocedural examination: Secondary | ICD-10-CM | POA: Diagnosis not present

## 2020-12-03 NOTE — Telephone Encounter (Signed)
Called patient to inform of new implant date, lvm for a return call

## 2020-12-04 ENCOUNTER — Telehealth: Payer: Self-pay | Admitting: *Deleted

## 2020-12-04 NOTE — Telephone Encounter (Signed)
RETURNED PATIENT'S PHONE CALL, LVM FOR A RETURN CALL 

## 2020-12-05 ENCOUNTER — Telehealth: Payer: Self-pay | Admitting: *Deleted

## 2020-12-05 DIAGNOSIS — Z23 Encounter for immunization: Secondary | ICD-10-CM | POA: Diagnosis not present

## 2020-12-05 NOTE — Telephone Encounter (Signed)
RETURNED PATIENT'S PHONE CALL, SPOKE WITH PATIENT. ?

## 2020-12-17 DIAGNOSIS — C61 Malignant neoplasm of prostate: Secondary | ICD-10-CM | POA: Diagnosis not present

## 2020-12-26 ENCOUNTER — Telehealth: Payer: Self-pay | Admitting: *Deleted

## 2020-12-26 NOTE — Telephone Encounter (Signed)
Called patient to inform of labs and Covid testing for 01-01-21, lvm for a return call

## 2020-12-29 ENCOUNTER — Other Ambulatory Visit: Payer: Self-pay

## 2020-12-29 ENCOUNTER — Encounter (HOSPITAL_BASED_OUTPATIENT_CLINIC_OR_DEPARTMENT_OTHER): Payer: Self-pay | Admitting: Urology

## 2020-12-29 NOTE — Progress Notes (Signed)
Spoke w/ via phone for pre-op interview--- PT Lab needs dos----  no             Lab results------ pt getting CBC, CMP, PT/ INR, PTT done 01-01-2021;  Current ekg/ cxr in epic/ chart COVID test ------ 01-01-2021 @ 1130 Arrive at -------  0930 on 01-02-2021 NPO after MN NO Solid Food.  Clear liquids from MN until--- 0830 Med rec completed Medications to take morning of surgery ----- NONE Diabetic medication ----- n/a Patient instructed to bring photo id and insurance card day of surgery Patient aware to have Driver (ride ) / caregiver    for 24 hours after surgery-- wife, Carl Flores Patient Special Instructions ----- will do fleet enema morning of surgery Pre-Op special Istructions ----- n/a Patient verbalized understanding of instructions that were given at this phone interview. Patient denies shortness of breath, chest pain, fever, cough at this phone interview.

## 2020-12-31 ENCOUNTER — Other Ambulatory Visit: Payer: BLUE CROSS/BLUE SHIELD

## 2021-01-01 ENCOUNTER — Other Ambulatory Visit (HOSPITAL_COMMUNITY)
Admission: RE | Admit: 2021-01-01 | Discharge: 2021-01-01 | Disposition: A | Discharge status: Home / Self Care | Payer: Medicare Other | Source: Ambulatory Visit | Attending: Urology | Admitting: Urology

## 2021-01-01 ENCOUNTER — Encounter (HOSPITAL_COMMUNITY)
Admission: RE | Admit: 2021-01-01 | Discharge: 2021-01-01 | Disposition: A | Discharge status: Home / Self Care | Payer: Medicare Other | Source: Ambulatory Visit | Attending: Urology | Admitting: Urology

## 2021-01-01 ENCOUNTER — Telehealth: Payer: Self-pay | Admitting: *Deleted

## 2021-01-01 ENCOUNTER — Other Ambulatory Visit: Payer: Self-pay

## 2021-01-01 DIAGNOSIS — Z01812 Encounter for preprocedural laboratory examination: Secondary | ICD-10-CM | POA: Insufficient documentation

## 2021-01-01 DIAGNOSIS — Z20822 Contact with and (suspected) exposure to covid-19: Secondary | ICD-10-CM | POA: Insufficient documentation

## 2021-01-01 LAB — COMPREHENSIVE METABOLIC PANEL
ALT: 28 U/L (ref 0–44)
AST: 32 U/L (ref 15–41)
Albumin: 4.3 g/dL (ref 3.5–5.0)
Alkaline Phosphatase: 63 U/L (ref 38–126)
Anion gap: 9 (ref 5–15)
BUN: 32 mg/dL — ABNORMAL HIGH (ref 8–23)
CO2: 26 mmol/L (ref 22–32)
Calcium: 9.5 mg/dL (ref 8.9–10.3)
Chloride: 107 mmol/L (ref 98–111)
Creatinine, Ser: 1.45 mg/dL — ABNORMAL HIGH (ref 0.61–1.24)
GFR, Estimated: 50 mL/min — ABNORMAL LOW (ref >60)
Glucose, Bld: 103 mg/dL — ABNORMAL HIGH (ref 70–99)
Potassium: 3.8 mmol/L (ref 3.5–5.1)
Sodium: 142 mmol/L (ref 135–145)
Total Bilirubin: 0.6 mg/dL (ref 0.3–1.2)
Total Protein: 6.9 g/dL (ref 6.5–8.1)

## 2021-01-01 LAB — CBC
HCT: 39.9 % (ref 39.0–52.0)
Hemoglobin: 13.1 g/dL (ref 13.0–17.0)
MCH: 31.7 pg (ref 26.0–34.0)
MCHC: 32.8 g/dL (ref 30.0–36.0)
MCV: 96.6 fL (ref 80.0–100.0)
Platelets: 151 10*3/uL (ref 150–400)
RBC: 4.13 MIL/uL — ABNORMAL LOW (ref 4.22–5.81)
RDW: 12.9 % (ref 11.5–15.5)
WBC: 3.2 10*3/uL — ABNORMAL LOW (ref 4.0–10.5)
nRBC: 0 % (ref 0.0–0.2)

## 2021-01-01 LAB — PROTIME-INR
INR: 1 (ref 0.8–1.2)
Prothrombin Time: 13.4 seconds (ref 11.4–15.2)

## 2021-01-01 LAB — APTT: aPTT: 28 seconds (ref 24–36)

## 2021-01-01 NOTE — Telephone Encounter (Signed)
RETURNED PATIENT'S PHONE CALL, LVM FOR A RETURN CALL 

## 2021-01-02 ENCOUNTER — Ambulatory Visit (HOSPITAL_BASED_OUTPATIENT_CLINIC_OR_DEPARTMENT_OTHER)
Admission: RE | Admit: 2021-01-02 | Discharge: 2021-01-02 | Disposition: A | Discharge status: Home / Self Care | Payer: Medicare Other | Attending: Urology | Admitting: Urology

## 2021-01-02 ENCOUNTER — Ambulatory Visit (HOSPITAL_BASED_OUTPATIENT_CLINIC_OR_DEPARTMENT_OTHER): Payer: Medicare Other | Admitting: Anesthesiology

## 2021-01-02 ENCOUNTER — Encounter: Payer: Self-pay | Admitting: Medical Oncology

## 2021-01-02 ENCOUNTER — Other Ambulatory Visit: Payer: Self-pay

## 2021-01-02 ENCOUNTER — Ambulatory Visit (HOSPITAL_COMMUNITY): Payer: Medicare Other

## 2021-01-02 ENCOUNTER — Encounter (HOSPITAL_BASED_OUTPATIENT_CLINIC_OR_DEPARTMENT_OTHER): Payer: Self-pay | Admitting: Urology

## 2021-01-02 ENCOUNTER — Encounter (HOSPITAL_BASED_OUTPATIENT_CLINIC_OR_DEPARTMENT_OTHER)
Admission: RE | Disposition: A | Discharge status: Home / Self Care | Payer: Self-pay | Source: Home / Self Care | Attending: Urology

## 2021-01-02 DIAGNOSIS — K4090 Unilateral inguinal hernia, without obstruction or gangrene, not specified as recurrent: Secondary | ICD-10-CM | POA: Diagnosis not present

## 2021-01-02 DIAGNOSIS — N401 Enlarged prostate with lower urinary tract symptoms: Secondary | ICD-10-CM | POA: Diagnosis not present

## 2021-01-02 DIAGNOSIS — M2041 Other hammer toe(s) (acquired), right foot: Secondary | ICD-10-CM | POA: Diagnosis not present

## 2021-01-02 DIAGNOSIS — L509 Urticaria, unspecified: Secondary | ICD-10-CM | POA: Diagnosis not present

## 2021-01-02 DIAGNOSIS — C61 Malignant neoplasm of prostate: Secondary | ICD-10-CM | POA: Insufficient documentation

## 2021-01-02 DIAGNOSIS — Z87891 Personal history of nicotine dependence: Secondary | ICD-10-CM | POA: Diagnosis not present

## 2021-01-02 DIAGNOSIS — N138 Other obstructive and reflux uropathy: Secondary | ICD-10-CM | POA: Diagnosis not present

## 2021-01-02 HISTORY — DX: Personal history of other diseases of the digestive system: Z87.19

## 2021-01-02 HISTORY — DX: Other intervertebral disc degeneration, lumbosacral region without mention of lumbar back pain or lower extremity pain: M51.379

## 2021-01-02 HISTORY — DX: Personal history of traumatic brain injury: Z87.820

## 2021-01-02 HISTORY — PX: CYSTOSCOPY: SHX5120

## 2021-01-02 HISTORY — DX: Other chronic pain: G89.29

## 2021-01-02 HISTORY — DX: Personal history of other diseases of male genital organs: Z87.438

## 2021-01-02 HISTORY — DX: Personal history of other malignant neoplasm of skin: Z85.828

## 2021-01-02 HISTORY — DX: Other intervertebral disc degeneration, lumbosacral region: M51.37

## 2021-01-02 HISTORY — DX: Benign prostatic hyperplasia with lower urinary tract symptoms: N40.1

## 2021-01-02 HISTORY — DX: Central serous chorioretinopathy, right eye: H35.711

## 2021-01-02 HISTORY — PX: SPACE OAR INSTILLATION: SHX6769

## 2021-01-02 HISTORY — DX: Spondylosis without myelopathy or radiculopathy, lumbar region: M47.816

## 2021-01-02 HISTORY — PX: RADIOACTIVE SEED IMPLANT: SHX5150

## 2021-01-02 HISTORY — DX: Personal history of urinary calculi: Z87.442

## 2021-01-02 HISTORY — DX: Personal history of adenomatous and serrated colon polyps: Z86.0101

## 2021-01-02 HISTORY — DX: Snoring: R06.83

## 2021-01-02 HISTORY — DX: Other chronic pain: M54.50

## 2021-01-02 HISTORY — DX: Other urticaria: L50.8

## 2021-01-02 HISTORY — DX: Personal history of colonic polyps: Z86.010

## 2021-01-02 LAB — SARS CORONAVIRUS 2 (TAT 6-24 HRS): SARS Coronavirus 2: NEGATIVE

## 2021-01-02 SURGERY — INSERTION, RADIATION SOURCE, PROSTATE
Anesthesia: General | Site: Urethra

## 2021-01-02 MED ORDER — FENTANYL CITRATE (PF) 100 MCG/2ML IJ SOLN
INTRAMUSCULAR | Status: DC | PRN
  Administered 2021-01-02 (×2): 25 ug via INTRAVENOUS

## 2021-01-02 MED ORDER — SODIUM CHLORIDE 0.9 % IV SOLN
INTRAVENOUS | Status: AC | PRN
  Administered 2021-01-02: 100 mL

## 2021-01-02 MED ORDER — FENTANYL CITRATE (PF) 100 MCG/2ML IJ SOLN
INTRAMUSCULAR | Status: AC
  Filled 2021-01-02: qty 2

## 2021-01-02 MED ORDER — FLEET ENEMA 7-19 GM/118ML RE ENEM: 1.0000 | ENEMA | Freq: Once | RECTAL | Status: DC

## 2021-01-02 MED ORDER — CIPROFLOXACIN IN D5W 400 MG/200ML IV SOLN
INTRAVENOUS | Status: AC
  Filled 2021-01-02: qty 200

## 2021-01-02 MED ORDER — FENTANYL CITRATE (PF) 100 MCG/2ML IJ SOLN: 25.0000 ug | INTRAMUSCULAR | Status: DC | PRN

## 2021-01-02 MED ORDER — DOXYCYCLINE HYCLATE 100 MG PO TABS: 100.0000 mg | ORAL_TABLET | Freq: Two times a day (BID) | ORAL | 0 refills | Status: DC

## 2021-01-02 MED ORDER — GLYCOPYRROLATE PF 0.2 MG/ML IJ SOSY
PREFILLED_SYRINGE | INTRAMUSCULAR | Status: AC
  Filled 2021-01-02: qty 1

## 2021-01-02 MED ORDER — LIDOCAINE 2% (20 MG/ML) 5 ML SYRINGE
INTRAMUSCULAR | Status: DC | PRN
  Administered 2021-01-02: 60 mg via INTRAVENOUS

## 2021-01-02 MED ORDER — ONDANSETRON HCL 4 MG/2ML IJ SOLN
INTRAMUSCULAR | Status: DC | PRN
  Administered 2021-01-02: 4 mg via INTRAVENOUS

## 2021-01-02 MED ORDER — PROPOFOL 10 MG/ML IV BOLUS
INTRAVENOUS | Status: AC
  Filled 2021-01-02: qty 20

## 2021-01-02 MED ORDER — GLYCOPYRROLATE 0.2 MG/ML IJ SOLN
INTRAMUSCULAR | Status: DC | PRN
  Administered 2021-01-02: .2 mg via INTRAVENOUS

## 2021-01-02 MED ORDER — IOHEXOL 300 MG/ML  SOLN
INTRAMUSCULAR | Status: DC | PRN
  Administered 2021-01-02: 7 mL via URETHRAL

## 2021-01-02 MED ORDER — CIPROFLOXACIN IN D5W 400 MG/200ML IV SOLN
400.0000 mg | INTRAVENOUS | Status: AC
  Administered 2021-01-02: 400 mg via INTRAVENOUS

## 2021-01-02 MED ORDER — DEXAMETHASONE SODIUM PHOSPHATE 10 MG/ML IJ SOLN
INTRAMUSCULAR | Status: AC
  Filled 2021-01-02: qty 1

## 2021-01-02 MED ORDER — SODIUM CHLORIDE (PF) 0.9 % IJ SOLN
INTRAMUSCULAR | Status: DC | PRN
  Administered 2021-01-02: 10 mL

## 2021-01-02 MED ORDER — LIDOCAINE 2% (20 MG/ML) 5 ML SYRINGE
INTRAMUSCULAR | Status: AC
  Filled 2021-01-02: qty 5

## 2021-01-02 MED ORDER — LACTATED RINGERS IV SOLN: INTRAVENOUS | Status: DC

## 2021-01-02 MED ORDER — ONDANSETRON HCL 4 MG/2ML IJ SOLN
INTRAMUSCULAR | Status: AC
  Filled 2021-01-02: qty 2

## 2021-01-02 MED ORDER — PROPOFOL 10 MG/ML IV BOLUS
INTRAVENOUS | Status: DC | PRN
  Administered 2021-01-02: 110 mg via INTRAVENOUS

## 2021-01-02 MED ORDER — ACETAMINOPHEN 500 MG PO TABS
1000.0000 mg | ORAL_TABLET | Freq: Once | ORAL | Status: AC
  Administered 2021-01-02: 1000 mg via ORAL

## 2021-01-02 MED ORDER — EPHEDRINE SULFATE-NACL 50-0.9 MG/10ML-% IV SOSY
PREFILLED_SYRINGE | INTRAVENOUS | Status: DC | PRN
  Administered 2021-01-02 (×4): 10 mg via INTRAVENOUS

## 2021-01-02 MED ORDER — ACETAMINOPHEN 500 MG PO TABS
ORAL_TABLET | ORAL | Status: AC
  Filled 2021-01-02: qty 2

## 2021-01-02 SURGICAL SUPPLY — 31 items
BAG DRN RND TRDRP ANRFLXCHMBR (UROLOGICAL SUPPLIES) ×3
BAG URINE DRAIN 2000ML AR STRL (UROLOGICAL SUPPLIES) ×4 IMPLANT
BLADE CLIPPER SENSICLIP SURGIC (BLADE) ×4 IMPLANT
CATH FOLEY 2WAY SLVR  5CC 16FR (CATHETERS) ×4
CATH FOLEY 2WAY SLVR 5CC 16FR (CATHETERS) ×3 IMPLANT
CATH ROBINSON RED A/P 20FR (CATHETERS) ×4 IMPLANT
CLOTH BEACON ORANGE TIMEOUT ST (SAFETY) ×4 IMPLANT
CNTNR URN SCR LID CUP LEK RST (MISCELLANEOUS) ×6 IMPLANT
CONT SPEC 4OZ STRL OR WHT (MISCELLANEOUS) ×8
COVER BACK TABLE 60X90IN (DRAPES) ×4 IMPLANT
COVER MAYO STAND STRL (DRAPES) ×4 IMPLANT
DRSG TEGADERM 4X4.75 (GAUZE/BANDAGES/DRESSINGS) ×7 IMPLANT
DRSG TEGADERM 8X12 (GAUZE/BANDAGES/DRESSINGS) ×8 IMPLANT
GAUZE SPONGE 4X4 12PLY STRL (GAUZE/BANDAGES/DRESSINGS) ×1 IMPLANT
GLOVE SURG ENC MOIS LTX SZ8 (GLOVE) ×8 IMPLANT
GLOVE SURG ORTHO LTX SZ8.5 (GLOVE) ×6 IMPLANT
GLOVE SURG UNDER POLY LF SZ7 (GLOVE) ×2 IMPLANT
GOWN STRL REUS W/TWL LRG LVL3 (GOWN DISPOSABLE) ×1 IMPLANT
GOWN STRL REUS W/TWL XL LVL3 (GOWN DISPOSABLE) ×5 IMPLANT
I-Seed AgX100 ×71 IMPLANT
IMPL SPACEOAR VUE SYSTEM (Spacer) IMPLANT
IMPLANT SPACEOAR VUE SYSTEM (Spacer) ×4 IMPLANT
IV NS 1000ML (IV SOLUTION) ×4
IV NS 1000ML BAXH (IV SOLUTION) ×3 IMPLANT
KIT TURNOVER CYSTO (KITS) ×4 IMPLANT
MARKER SKIN DUAL TIP RULER LAB (MISCELLANEOUS) ×4 IMPLANT
PACK CYSTO (CUSTOM PROCEDURE TRAY) ×4 IMPLANT
SYR 10ML LL (SYRINGE) ×4 IMPLANT
TOWEL OR 17X26 10 PK STRL BLUE (TOWEL DISPOSABLE) ×4 IMPLANT
UNDERPAD 30X36 HEAVY ABSORB (UNDERPADS AND DIAPERS) ×8 IMPLANT
WATER STERILE IRR 500ML POUR (IV SOLUTION) ×4 IMPLANT

## 2021-01-02 NOTE — Transfer of Care (Signed)
Immediate Anesthesia Transfer of Care Note  Patient: Carl Flores  Procedure(s) Performed: RADIOACTIVE SEED IMPLANT/BRACHYTHERAPY IMPLANT (N/A Prostate) SPACE OAR INSTILLATION (N/A ) CYSTOSCOPY FLEXIBLE (Urethra)  Patient Location: PACU  Anesthesia Type:General  Level of Consciousness: awake, alert  and oriented  Airway & Oxygen Therapy: Patient Spontanous Breathing and Patient connected to nasal cannula oxygen  Post-op Assessment: Report given to RN  Post vital signs: Reviewed and stable  Last Vitals:  Vitals Value Taken Time  BP 125/73 01/02/21 1312  Temp    Pulse 69 01/02/21 1315  Resp 22 01/02/21 1315  SpO2 97 % 01/02/21 1315  Vitals shown include unvalidated device data.  Last Pain:  Vitals:   01/02/21 1001  TempSrc: Oral  PainSc: 5       Patients Stated Pain Goal: 5 (51/02/58 5277)  Complications: No complications documented.

## 2021-01-02 NOTE — Discharge Instructions (Signed)
CYSTOSCOPY HOME CARE INSTRUCTIONS  Activity: Rest for the remainder of the day.  Do not drive or operate equipment today.  You may resume normal activities in one to two days as instructed by your physician.   Meals: Drink plenty of liquids and eat light foods such as gelatin or soup this evening.  You may return to a normal meal plan tomorrow.  Return to Work: You may return to work in one to two days or as instructed by your physician.  Special Instructions / Symptoms: Call your physician if any of these symptoms occur:   -persistent or heavy bleeding  -bleeding which continues after first few urination  -large blood clots that are difficult to pass  -urine stream diminishes or stops completely  -fever equal to or higher than 101 degrees Farenheit.  -cloudy urine with a strong, foul odor  -severe pain  Post Anesthesia Home Care Instructions  Activity: Get plenty of rest for the remainder of the day. A responsible adult should stay with you for 24 hours following the procedure.  For the next 24 hours, DO NOT: -Drive a car -Paediatric nurse -Drink alcoholic beverages -Take any medication unless instructed by your physician -Make any legal decisions or sign important papers.  Meals: Start with liquid foods such as gelatin or soup. Progress to regular foods as tolerated. Avoid greasy, spicy, heavy foods. If nausea and/or vomiting occur, drink only clear liquids until the nausea and/or vomiting subsides. Call your physician if vomiting continues.  Special Instructions/Symptoms: Your throat may feel dry or sore from the anesthesia or the breathing tube placed in your throat during surgery. If this causes discomfort, gargle with warm salt water. The discomfort should disappear within 24 hours.  If you had a scopolamine patch placed behind your ear for the management of post- operative nausea and/or vomiting:  1. The medication in the patch is effective for 72 hours, after which  it should be removed.  Wrap patch in a tissue and discard in the trash. Wash hands thoroughly with soap and water. 2. You may remove the patch earlier than 72 hours if you experience unpleasant side effects which may include dry mouth, dizziness or visual disturbances. 3. Avoid touching the patch. Wash your hands with soap and water after contact with the patch.     Radioactive Seed Implant Home Care Instructions   Activity:    Rest for the remainder of the day.  Do not drive or operate equipment today.  You may resume normal  activities in a few days as instructed by your physician, without risk of harmful radiation exposure to those around you, provided you follow the time and distance precautions on the Radiation Oncology Instruction Sheet.   Meals: Drink plenty of lipuids and eat light foods, such as gelatin or soup this evening .  You may return to normal meal plan tomorrow.  Return To Work: You may return to work as instructed by Naval [employer].  Special Instruction:   If any seeds are found, use tweezers to pick up seeds and place in a glass container of any kind and bring to your physician's office.  Call your physician if any of these symptoms occur:   Persistent or heavy bleeding  Urine stream diminishes or stops completely after catheter is removed  Fever equal to or greater than 101 degrees F  Cloudy urine with a strong foul odor  Severe pain  You may feel some burning pain and/or hesitancy when you urinate after the  catheter is removed.  These symptoms may increase over the next few weeks, but should diminish within forur to six weeks.  Applying moist heat to the lower abdomen or a hot tub bath may help relieve the pain.  If the discomfort becomes severe, please call your physician for additional medications.

## 2021-01-02 NOTE — Anesthesia Postprocedure Evaluation (Signed)
Anesthesia Post Note  Patient: Carl Flores  Procedure(s) Performed: RADIOACTIVE SEED IMPLANT/BRACHYTHERAPY IMPLANT (N/A Prostate) SPACE OAR INSTILLATION (N/A ) CYSTOSCOPY FLEXIBLE (Urethra)     Patient location during evaluation: PACU Anesthesia Type: General Level of consciousness: sedated Pain management: pain level controlled Vital Signs Assessment: post-procedure vital signs reviewed and stable Respiratory status: spontaneous breathing and respiratory function stable Cardiovascular status: stable Postop Assessment: no apparent nausea or vomiting Anesthetic complications: no   No complications documented.  Last Vitals:  Vitals:   01/02/21 1330 01/02/21 1345  BP: 121/61 130/65  Pulse: 66 64  Resp: 16 16  Temp:  36.6 C  SpO2: 99% 100%    Last Pain:  Vitals:   01/02/21 1315  TempSrc:   PainSc: 6                  Marvyn Torrez Namon

## 2021-01-02 NOTE — H&P (Signed)
H&P  Chief Complaint:  Prostate cancer  History of Present Illness:  75 year old male  With  GG4  adenocarcinoma  Of the prostate.  He  Has been started on ADT and presents at this time for brachytherapy seed boost in advance of external beam radiotherapy.  Past Medical History:  Diagnosis Date  . 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 BPH associated with nocturia   . Chronic low back pain    followed by dr Vertell Limber  . 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 DDD (degenerative disc disease), lumbosacral   . History of adenomatous polyp of colon   . History of basal cell carcinoma (BCC) excision    s/p  moh's of nose approx 2012  . History of chronic prostatitis   . History of concussion    per pt yrs ago with no residual  . History of gastroesophageal reflux (GERD)   . History of kidney stones   . Lumbar spondylosis   . Prostate cancer [facility]) urologist-- dr Becca Bayne/ radiation onology--- dr Tammi Klippel   dx 11/ 2021,  Stage T1c, Gleason 4+4  . Snores    per pt has not had a sleep study but uses dental appliance at home    Past Surgical History:  Procedure Laterality Date  . CATARACT EXTRACTION W/ INTRAOCULAR LENS  IMPLANT, BILATERAL  2009;  2013 approx.  . COLONOSCOPY WITH PROPOFOL N/A 08/01/2018   Procedure: COLONOSCOPY WITH PROPOFOL;  Surgeon: Laurence Spates, MD;  Location: [facility] ENDOSCOPY;  Service: Endoscopy;  Laterality: N/A;  . MOHS SURGERY  2012 approx  . POLYPECTOMY  08/01/2018   Procedure: POLYPECTOMY;  Surgeon: Laurence Spates, MD;  Location: [facility] ENDOSCOPY;  Service: Endoscopy;;  . PROSTATE BIOPSY    . TONSILLECTOMY  CHILD    Home Medications:    Allergies:  Allergies  Allergen Reactions  . Cephalexin Hives  . Erythromycin Hives  . Prednisone Other (See Comments)    Could  complicate eye condition,  PER PT CONTRAINDICATION WITH STEROIDS OF ANY FORM DUE TO RIGHT EYE CENTRAL SEROUS  . Sulfa Antibiotics Hives    Family History  Problem Relation Age of Onset  . Colon cancer Mother   . Colon cancer Cousin   . Colon cancer Cousin   . Colon cancer Cousin   . Breast cancer Daughter   . Prostate cancer Neg Hx   . Pancreatic cancer Neg Hx     Social History:  reports that he quit smoking about 52 years ago. His smoking use included cigarettes. He quit after 5.00 years of use. He has never used smokeless tobacco. He reports current alcohol use. He reports that he does not use drugs.  ROS: A complete review of systems was performed.  All systems are negative except for pertinent findings as noted.  Physical Exam:  Vital signs in last 24 hours: BP 139/82   Pulse 72   Temp (!) 97.4 F (36.3 C) (Oral)   Resp 12   Ht 6' (1.829 m)   Wt 82.1 kg   SpO2 100%   BMI 24.55 kg/m  Constitutional:  Alert and oriented, No acute distress Cardiovascular: Regular rate  Respiratory: Normal respiratory effort GI: Abdomen is soft, nontender, nondistended, no abdominal masses. No CVAT.  Genitourinary: Normal male phallus, testes are descended bilaterally  and non-tender and without masses, scrotum is normal in appearance without lesions or masses, perineum is normal on inspection. Lymphatic: No lymphadenopathy Neurologic: Grossly intact, no focal deficits Psychiatric: Normal mood and affect  Laboratory Data:  Recent Labs    01/01/21 0953  WBC 3.2*  HGB 13.1  HCT 39.9  PLT 151    Recent Labs    01/01/21 0953  NA 142  K 3.8  CL 107  GLUCOSE 103*  BUN 32*  CALCIUM 9.5  CREATININE 1.45*     No results found for this or any previous visit (from the past 24 hour(s)). Recent Results (from the past 240 hour(s))  SARS CORONAVIRUS 2 (TAT 6-24 HRS) Nasopharyngeal Nasopharyngeal Swab     Status: None   Collection Time: 01/01/21 11:03 AM   Specimen: Nasopharyngeal  Swab  Result Value Ref Range Status   SARS Coronavirus 2 NEGATIVE NEGATIVE Final    Comment: (NOTE) SARS-CoV-2 target nucleic acids are NOT DETECTED.  The SARS-CoV-2 RNA is generally detectable in upper and lower respiratory specimens during the acute phase of infection. Negative results do not preclude SARS-CoV-2 infection, do not rule out co-infections with other pathogens, and should not be used as the sole basis for treatment or other patient management decisions. Negative results must be combined with clinical observations, patient history, and epidemiological information. The expected result is Negative.  Fact Sheet for Patients: SugarRoll.be  Fact Sheet for Healthcare Providers: https://www.woods-mathews.com/  This test is not yet approved or cleared by the [place] FDA and  has been authorized for detection and/or diagnosis of SARS-CoV-2 by FDA under an Emergency Use Authorization (EUA). This EUA will remain  in effect (meaning this test can be used) for the duration of the COVID-19 declaration under Se ction 564(b)(1) of the Act, 21 U.S.C. section 360bbb-3(b)(1), unless the authorization is terminated or revoked sooner.  Performed at [facility], [place] [address] [place], [place] [address]     Renal Function: Recent Labs    01/01/21 0254  CREATININE 1.45*   Estimated Creatinine Clearance: 47.6 mL/min (A) (by C-G formula based on SCr of 1.45 mg/dL (H)).  Radiologic Imaging: No results found.  Impression/Assessment:    Adenocarcinoma the prostate  Plan:   I 125 brachytherapy, placement of Space OAR

## 2021-01-02 NOTE — Op Note (Signed)
Preoperative diagnosis: Clinical stage TI C adenocarcinoma the prostate   Postoperative diagnosis: Same   Procedure: I-125 prostate seed implantation, SpaceOAR placement, flexible cystoscopy  Surgeon: Lillette Boxer. Ramey Ketcherside M.D.  Radiation Oncologist: Tyler Pita, M.D.  Anesthesia: Gen.   Indications: Patient  was diagnosed with clinical stage TI C prostate cancer. We had extensive discussion with him about treatment options versus. He elected to proceed with seed implantation. He underwent consultation my office as well as with Dr. Tammi Klippel. He appeared to understand the advantages disadvantages potential risks of this treatment option. Full informed consent has been obtained.   Technique and findings: Patient was brought the operating room where he had successful induction of general anesthesia. He was placed in dorso-lithotomy position and prepped and draped in usual manner. Appropriate surgical timeout was performed. Radiation oncology department placed a transrectal ultrasound probe anchoring stand. Foley catheter with contrast in the balloon was inserted without difficulty. Anchoring needles were placed within the prostate. Rectal tube was placed. Real-time contouring of the urethra prostate and rectum were performed and the dosing parameters were established. Targeted dose was 110 gray.  I was then called  to the operating suite suite for placement of the needles. A second timeout was performed. All needle passage was done with real-time transrectal ultrasound guidance with the sagittal plane. A total of 17 needles were placed.  71 active seeds were implanted.  I then proceeded with placement of SpaceOAR by introducing a needle with the bevel angled inferiorly approximately 2 cm superior to the anus. This was angled downward and under direct ultrasound was placed within the space between the prostatic capsule and rectum. This was confirmed with a small amount of sterile saline injected and  this was performed under direct ultrasound. I then attached the SpaceOAR to the needle and injected this in the space between the prostate and rectum with good placement noted. The Foley catheter was removed and flexible cystoscopy failed to show any seeds outside the prostate. Urothelium of bladder was normal. Slight obstruction from BPH w/ median lobe. The patient was brought to recovery room in stable condition, having tolerated the procedure well.Marland Kitchen

## 2021-01-02 NOTE — Anesthesia Procedure Notes (Signed)
Procedure Name: LMA Insertion Date/Time: 01/02/2021 11:56 AM Performed by: Bonney Aid, CRNA Pre-anesthesia Checklist: Patient identified, Emergency Drugs available, Suction available and Patient being monitored Patient Re-evaluated:Patient Re-evaluated prior to induction Oxygen Delivery Method: Circle system utilized Preoxygenation: Pre-oxygenation with 100% oxygen Induction Type: IV induction Ventilation: Mask ventilation without difficulty LMA: LMA inserted LMA Size: 5.0 Number of attempts: 1 Airway Equipment and Method: Bite block Placement Confirmation: positive ETCO2 Tube secured with: Tape Dental Injury: Teeth and Oropharynx as per pre-operative assessment

## 2021-01-02 NOTE — Anesthesia Preprocedure Evaluation (Addendum)
Anesthesia Evaluation  Patient identified by MRN, date of birth, ID band Patient awake    Reviewed: Allergy & Precautions, NPO status , Patient's Chart, lab work & pertinent test results  History of Anesthesia Complications Negative for: history of anesthetic complications  Airway Mallampati: II  TM Distance: >3 FB Neck ROM: Full    Dental no notable dental hx. (+) Dental Advisory Given   Pulmonary neg pulmonary ROS, former smoker,    Pulmonary exam normal        Cardiovascular negative cardio ROS Normal cardiovascular exam     Neuro/Psych negative neurological ROS     GI/Hepatic negative GI ROS, Neg liver ROS,   Endo/Other  negative endocrine ROS  Renal/GU negative Renal ROS     Musculoskeletal negative musculoskeletal ROS (+)   Abdominal   Peds  Hematology negative hematology ROS (+)   Anesthesia Other Findings   Reproductive/Obstetrics                            Anesthesia Physical Anesthesia Plan  ASA: II  Anesthesia Plan: General   Post-op Pain Management:    Induction: Intravenous  PONV Risk Score and Plan: 3 and Ondansetron and Diphenhydramine  Airway Management Planned: LMA  Additional Equipment:   Intra-op Plan:   Post-operative Plan: Extubation in OR  Informed Consent: I have reviewed the patients History and Physical, chart, labs and discussed the procedure including the risks, benefits and alternatives for the proposed anesthesia with the patient or authorized representative who has indicated his/her understanding and acceptance.     Dental advisory given  Plan Discussed with: Anesthesiologist and CRNA  Anesthesia Plan Comments:        Anesthesia Quick Evaluation

## 2021-01-04 NOTE — Progress Notes (Signed)
  Radiation Oncology         [phone] ________________________________  Name: Carl Flores MRN: [id number]  Date: 01/04/2021  DOB: 26-Jun-1946       Prostate Seed Implant  RP:[id number], [name], MD  No ref. provider found  DIAGNOSIS:  75 y.o. gentleman with stage T1c adenocarcinoma of the prostate with a Gleason's score of 4+4 and a PSA of 7.9  PROCEDURE: Insertion of radioactive I-125 seeds into the prostate gland.  RADIATION DOSE: 110 Gy, boost therapy.  TECHNIQUE: [name] was brought to the operating room with the urologist. He was placed in the dorsolithotomy position. He was catheterized and a rectal tube was inserted. The perineum was shaved, prepped and draped. The ultrasound probe was then introduced into the rectum to see the prostate gland.  TREATMENT DEVICE: A needle grid was attached to the ultrasound probe stand and anchor needles were placed.  3D PLANNING: The prostate was imaged in 3D using a sagittal sweep of the prostate probe. These images were transferred to the planning computer. There, the prostate, urethra and rectum were defined on each axial reconstructed image. Then, the software created an optimized 3D plan and a few seed positions were adjusted. The quality of the plan was reviewed using [facility] information for the target and the following two organs at risk:  Urethra and Rectum.  Then the accepted plan was printed and handed off to the radiation therapist.  Under my supervision, the custom loading of the seeds and spacers was carried out and loaded into sealed vicryl sleeves.  These pre-loaded needles were then placed into the needle holder.Marland Kitchen  PROSTATE VOLUME STUDY:  Using transrectal ultrasound the volume of the prostate was verified to be 48 cc.  SPECIAL TREATMENT PROCEDURE/SUPERVISION AND HANDLING: The pre-loaded needles were then delivered under sagittal guidance. A total of 17 needles were used to deposit 71 seeds in the prostate gland. The individual  seed activity was 0.410 mCi.  SpaceOAR:  Yes  COMPLEX SIMULATION: At the end of the procedure, an anterior radiograph of the pelvis was obtained to document seed positioning and count. Cystoscopy was performed to check the urethra and bladder.  MICRODOSIMETRY: At the end of the procedure, the patient was emitting 0.102 mR/hr at 1 meter. Accordingly, he was considered safe for hospital discharge.  PLAN: The patient will return to the radiation oncology clinic for post implant CT dosimetry in three weeks.   ________________________________  Sheral Apley Tammi Klippel, M.D.

## 2021-01-05 ENCOUNTER — Emergency Department (HOSPITAL_COMMUNITY)
Admission: EM | Admit: 2021-01-05 | Discharge: 2021-01-05 | Disposition: A | Discharge status: Home / Self Care | Payer: Medicare Other | Attending: Emergency Medicine | Admitting: Emergency Medicine

## 2021-01-05 ENCOUNTER — Encounter (HOSPITAL_COMMUNITY): Payer: Self-pay

## 2021-01-05 ENCOUNTER — Other Ambulatory Visit: Payer: Self-pay

## 2021-01-05 DIAGNOSIS — Z8546 Personal history of malignant neoplasm of prostate: Secondary | ICD-10-CM | POA: Insufficient documentation

## 2021-01-05 DIAGNOSIS — Z7982 Long term (current) use of aspirin: Secondary | ICD-10-CM | POA: Insufficient documentation

## 2021-01-05 DIAGNOSIS — Z87891 Personal history of nicotine dependence: Secondary | ICD-10-CM | POA: Diagnosis not present

## 2021-01-05 DIAGNOSIS — R39198 Other difficulties with micturition: Secondary | ICD-10-CM | POA: Diagnosis not present

## 2021-01-05 DIAGNOSIS — R339 Retention of urine, unspecified: Secondary | ICD-10-CM | POA: Diagnosis not present

## 2021-01-05 DIAGNOSIS — R1030 Lower abdominal pain, unspecified: Secondary | ICD-10-CM | POA: Diagnosis not present

## 2021-01-05 LAB — URINALYSIS, ROUTINE W REFLEX MICROSCOPIC
Bilirubin Urine: NEGATIVE
Glucose, UA: NEGATIVE mg/dL
Ketones, ur: NEGATIVE mg/dL
Leukocytes,Ua: NEGATIVE
Nitrite: NEGATIVE
Protein, ur: NEGATIVE mg/dL
Specific Gravity, Urine: 1.015 (ref 1.005–1.030)
pH: 5 (ref 5.0–8.0)

## 2021-01-05 MED ORDER — DOXYCYCLINE HYCLATE 100 MG PO CAPS: 100.0000 mg | ORAL_CAPSULE | Freq: Two times a day (BID) | ORAL | 0 refills | Status: DC

## 2021-01-05 NOTE — ED Provider Notes (Signed)
[facility] DEPT Provider Note   CSN: [id number] Arrival date & time: 01/05/21  7169     History Chief Complaint  Patient presents with  . Urinary Retention    Carl Flores is a 75 y.o. male.  75 year old male with prior medical history as detailed below presents for evaluation.  Patient reports prostate seeding on Friday of last week with urology.  Patient reports that he had difficulty urinating over the late weekend.  His last urination was approximately 6 hours prior.  He complains of suprapubic pain and pressure.  He denies fever.  He denies bloody urine.  He is otherwise without complaint.  He denies significant history of urinary obstruction or pathology requiring Foley catheter placement.  The history is provided by the patient and medical records.  Illness Location:  Unable to urinate for 6-8 hours Severity:  Mild Onset quality:  Gradual Duration:  3 days Timing:  Constant Progression:  Worsening Chronicity:  New Associated symptoms: no fever        Past Medical History:  Diagnosis Date  . 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 BPH associated with nocturia   . Chronic low back pain    followed by dr Vertell Limber  . 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 DDD (degenerative disc disease), lumbosacral   . History of adenomatous polyp of colon   . History of basal cell carcinoma (BCC) excision    s/p  moh's of nose approx 2012  . History of chronic prostatitis   . History of concussion    per pt yrs ago with no residual  . History of gastroesophageal reflux (GERD)   . History of kidney stones   . Lumbar spondylosis   . Prostate cancer [facility]) urologist-- dr dahlstedt/ radiation onology--- dr Tammi Klippel   dx 11/ 2021,  Stage T1c, Gleason 4+4  .  Snores    per pt has not had a sleep study but uses dental appliance at home    Patient Active Problem List   Diagnosis Date Noted  . Malignant neoplasm of prostate ([place]) 08/27/2020  . Acquired hammer toe of right foot 08/25/2020  . Hardening of the aorta (main artery of the heart) ([place]) 08/25/2020  . Inguinal hernia 08/25/2020  . Onychomycosis 08/25/2020  . Personal history of colonic polyps 08/25/2020  . Shoulder joint pain 08/25/2020  . Autoimmune urticaria 06/28/2017  . Central serous retinopathy 01/09/2013  . Urticaria 01/09/2013    Past Surgical History:  Procedure Laterality Date  . CATARACT EXTRACTION W/ INTRAOCULAR LENS  IMPLANT, BILATERAL  2009;  2013 approx.  . COLONOSCOPY WITH PROPOFOL N/A 08/01/2018   Procedure: COLONOSCOPY WITH PROPOFOL;  Surgeon: Laurence Spates, MD;  Location: [facility];  Service: Endoscopy;  Laterality: N/A;  . CYSTOSCOPY  01/02/2021   Procedure: CYSTOSCOPY FLEXIBLE;  Surgeon: Franchot Gallo, MD;  Location: [facility];  Service: Urology;;  . MOHS SURGERY  2012 approx  . POLYPECTOMY  08/01/2018   Procedure: POLYPECTOMY;  Surgeon: Laurence Spates, MD;  Location: [facility];  Service: Endoscopy;;  . PROSTATE BIOPSY    . RADIOACTIVE SEED IMPLANT N/A 01/02/2021   Procedure: RADIOACTIVE SEED IMPLANT/BRACHYTHERAPY IMPLANT;  Surgeon: Franchot Gallo, MD;  Location: [facility];  Service: Urology;  Laterality: N/A;  .  SPACE OAR INSTILLATION N/A 01/02/2021   Procedure: SPACE OAR INSTILLATION;  Surgeon: Franchot Gallo, MD;  Location[facility];  Service: Urology;  Laterality: N/A;  . TONSILLECTOMY  CHILD       Family History  Problem Relation Age of Onset  . Colon cancer Mother   . Colon cancer Cousin   . Colon cancer Cousin   . Colon cancer Cousin   . Breast cancer Daughter   . Prostate cancer Neg Hx   . Pancreatic cancer Neg Hx     Social History   Tobacco Use  . Smoking status: Former  Smoker    Years: 5.00    Types: Cigarettes    Quit date: 12/29/1968    Years since quitting: 52.0  . Smokeless tobacco: Never Used  Vaping Use  . Vaping Use: Never used  Substance Use Topics  . Alcohol use: Yes  . Drug use: Never    Home Medications Prior to Admission medications   Medication Sig Start Date End Date Taking? Authorizing Provider  doxycycline (VIBRAMYCIN) 100 MG capsule Take 1 capsule (100 mg total) by mouth 2 (two) times daily. 01/05/21  Yes Valarie Merino, MD  acetaminophen (TYLENOL) 325 MG tablet Take 650 mg by mouth every 6 (six) hours as needed.    [provider]  aspirin 325 MG tablet Take 71 mg by mouth daily.    [provider]  Bioflavonoid Products (GRAPE SEED PO) Take 1 fluid ounce by mouth daily.    [provider]  Calcium Carb-Cholecalciferol (CALCIUM 600+D3 PO) Take 1 capsule by mouth daily.    [provider]  Coenzyme Q10 (CO Q-10) 100 MG CAPS Take 1 capsule by mouth daily.    [provider]  doxycycline (VIBRA-TABS) 100 MG tablet Take 1 tablet (100 mg total) by mouth 2 (two) times daily. 01/02/21   Franchot Gallo, MD  Ginkgo Biloba 120 MG TABS Take 120 mg by mouth daily.    [provider]  Glucosamine-Chondroit-Vit C-Mn (GLUCOSAMINE 1500 COMPLEX) CAPS Take 1 capsule by mouth daily in the afternoon.    [provider]  hydroxychloroquine (PLAQUENIL) 200 MG tablet Take 150 mg by mouth daily.    [provider]  levocetirizine (XYZAL) 5 MG tablet Take 5 mg by mouth every evening.    [provider]  MAGNESIUM OXIDE PO Take 800 mg by mouth daily.    [provider]  Misc Natural Products ([place]) daily.    [provider]  Misc Natural Products (TART CHERRY ADVANCED PO) Take 1 capsule by mouth daily.    [provider]  Multiple Vitamins-Minerals (OCUVITE PO) Take 1 tablet by mouth daily.    [provider]  Multiple  Vitamins-Minerals (ONE-A-DAY MENS 50+ ADVANTAGE PO) Take by mouth daily.    [provider]  OVER THE COUNTER MEDICATION daily. [place]    [provider]  traMADol (ULTRAM) 50 MG tablet Take 25-50 mg by mouth every 6 (six) hours as needed. 08/07/20   [provider]    Allergies    Cephalexin, Erythromycin, Prednisone, and Sulfa antibiotics  Review of Systems   Review of Systems  Constitutional: Negative for fever.  All other systems reviewed and are negative.   Physical Exam Updated Vital Signs BP 113/68 (BP Location: Left Arm)   Pulse (!) 57   Temp 97.7 F (36.5 C) (Oral)   Resp 18   Ht 6' (1.829 m)   Wt 63.2  kg   SpO2 100%   BMI 18.91 kg/m   Physical Exam Vitals and nursing note reviewed.  Constitutional:      General: He is not in acute distress.    Appearance: Normal appearance. He is well-developed.  HENT:     Head: Normocephalic and atraumatic.  Eyes:     Conjunctiva/sclera: Conjunctivae normal.     Pupils: Pupils are equal, round, and reactive to light.  Cardiovascular:     Rate and Rhythm: Normal rate and regular rhythm.     Heart sounds: Normal heart sounds.  Pulmonary:     Effort: Pulmonary effort is normal. No respiratory distress.     Breath sounds: Normal breath sounds.  Abdominal:     General: There is no distension.     Palpations: Abdomen is soft.     Tenderness: There is no abdominal tenderness.     Comments: Mild suprapubic tenderness with palpation  Musculoskeletal:        General: No deformity. Normal range of motion.     Cervical back: Normal range of motion and neck supple.  Skin:    General: Skin is warm and dry.  Neurological:     Mental Status: He is alert and oriented to person, place, and time.     ED Results / Procedures / Treatments   Labs (all labs ordered are listed, but only abnormal results are displayed) Labs Reviewed  URINALYSIS, ROUTINE W REFLEX MICROSCOPIC - Abnormal; Notable  for the following components:      Result Value   Hgb urine dipstick SMALL (*)    Bacteria, UA RARE (*)    All other components within normal limits    EKG None  Radiology No results found.  Procedures Procedures   Medications Ordered in ED Medications - No data to display  ED Course  I have reviewed the triage vital signs and the nursing notes.  Pertinent labs & imaging results that were available during my care of the patient were reviewed by me and considered in my medical decision making (see chart for details).    MDM Rules/Calculators/A&P                          MDM  MSE complete  Carl Flores was evaluated in Emergency Department on 01/05/2021 for the symptoms described in the history of present illness. He was evaluated in the context of the global COVID-19 pandemic, which necessitated consideration that the patient might be at risk for infection with the SARS-CoV-2 virus that causes COVID-19. Institutional protocols and algorithms that pertain to the evaluation of patients at risk for COVID-19 are in a state of rapid change based on information released by regulatory bodies including the CDC and federal and state organizations. These policies and algorithms were followed during the patient's care in the ED.  Patient presented with mild urinary retention reported after recent intervention by urology last week.  Case discussed with Dr. Tresa Moore covering urology.  Plan for Foley catheter placement agreed upon.  Patient feels improved after catheter placement.  Patient instructed in care of catheter.  Patient has already arranged follow-up with his urology team at [facility].  Strict return precautions given understood.  Importance of close follow-up is stressed.  Final Clinical Impression(s) / ED Diagnoses Final diagnoses:  Urinary retention    Rx / DC Orders ED Discharge Orders         Ordered    doxycycline (VIBRAMYCIN) 100 MG  capsule  2 times daily         01/05/21 0853           Valarie Merino, MD 01/05/21 [id number]

## 2021-01-05 NOTE — Discharge Instructions (Signed)
Return for any problem.  ?

## 2021-01-05 NOTE — ED Triage Notes (Signed)
Patient states he had his foley cath removed 2 days ago and has not urinated since Saturday night.

## 2021-01-06 ENCOUNTER — Telehealth: Payer: Self-pay | Admitting: *Deleted

## 2021-01-06 NOTE — Telephone Encounter (Signed)
RETURNED PATIENT'S PHONE CALL, SPOKE WITH PATIENT. ?

## 2021-01-09 DIAGNOSIS — R338 Other retention of urine: Secondary | ICD-10-CM | POA: Diagnosis not present

## 2021-01-15 ENCOUNTER — Telehealth: Payer: Self-pay | Admitting: *Deleted

## 2021-01-15 NOTE — Telephone Encounter (Signed)
Called patient to remind of sim appt. for 01-16-21- arrival time- 8:15 am @ [facility], lvm for a return call

## 2021-01-16 ENCOUNTER — Encounter: Payer: Self-pay | Admitting: Medical Oncology

## 2021-01-16 ENCOUNTER — Ambulatory Visit
Admission: RE | Admit: 2021-01-16 | Discharge: 2021-01-16 | Disposition: A | Discharge status: Home / Self Care | Payer: Medicare Other | Source: Ambulatory Visit | Attending: Radiation Oncology | Admitting: Radiation Oncology

## 2021-01-16 ENCOUNTER — Other Ambulatory Visit: Payer: Self-pay

## 2021-01-16 DIAGNOSIS — C61 Malignant neoplasm of prostate: Secondary | ICD-10-CM | POA: Insufficient documentation

## 2021-01-16 NOTE — Progress Notes (Signed)
  Radiation Oncology         (336) [phone] ________________________________  Name: Carl Flores MRN: [id number]  Date: 01/16/2021  DOB: 1946/01/08  COMPLEX SIMULATION NOTE  NARRATIVE:  The patient was brought to the [facility] today following prostate seed implantation approximately one month ago.  Identity was confirmed.  All relevant records and images related to the planned course of therapy were reviewed.  Then, the patient was set-up supine.  CT images were obtained.  The CT images were loaded into the planning software.  Then the prostate and rectum were contoured.  Treatment planning then occurred.  The implanted iodine 125 seeds were identified by the physics staff for projection of radiation distribution  I have requested : 3D Simulation  I have requested a DVH of the following structures: Prostate and rectum.    ________________________________  Sheral Apley Tammi Klippel, M.D.

## 2021-01-16 NOTE — Progress Notes (Signed)
  Radiation Oncology         (336) [phone] ________________________________  Name: Carl Flores MRN: [id number]  Date: 01/16/2021  DOB: 1946-06-04  SIMULATION AND TREATMENT PLANNING NOTE    ICD-10-CM   1. Malignant neoplasm of prostate ([place])  C61     DIAGNOSIS:  75 y.o. gentleman with stage T1c adenocarcinoma of the prostate with a Gleason's score of 4+4 and a PSA of 7.9  NARRATIVE:  The patient was brought to the [facility].  Identity was confirmed.  All relevant records and images related to the planned course of therapy were reviewed.  The patient freely provided informed written consent to proceed with treatment after reviewing the details related to the planned course of therapy. The consent form was witnessed and verified by the simulation staff.  Then, the patient was set-up in a stable reproducible supine position for radiation therapy.  A vacuum lock pillow device was custom fabricated to position his legs in a reproducible immobilized position.  Then, I performed a urethrogram under sterile conditions to identify the prostatic apex.  CT images were obtained.  Surface markings were placed.  The CT images were loaded into the planning software.  Then the prostate target and avoidance structures including the rectum, bladder, bowel and hips were contoured.  Treatment planning then occurred.  The radiation prescription was entered and confirmed.  A total of one complex treatment devices were fabricated. I have requested : Intensity Modulated Radiotherapy (IMRT) is medically necessary for this case for the following reason:  Rectal sparing.Marland Kitchen  PLAN:  The patient will receive 45 Gy in 25 fractions of 1.8 Gy, to supplement an up-front prostate seed implant boost of 110 Gy to achieve a total nominal dose of 155 Gy.  ________________________________  Sheral Apley Tammi Klippel, M.D.  This document serves as a record of services personally performed by Tyler Pita, MD. It was  created on his behalf by Wilburn Mylar, a trained medical scribe. The creation of this record is based on the scribe's personal observations and the provider's statements to them. This document has been checked and approved by the attending provider.

## 2021-01-20 DIAGNOSIS — C61 Malignant neoplasm of prostate: Secondary | ICD-10-CM | POA: Diagnosis not present

## 2021-01-22 ENCOUNTER — Other Ambulatory Visit: Payer: Self-pay

## 2021-01-22 ENCOUNTER — Ambulatory Visit
Admission: RE | Admit: 2021-01-22 | Discharge: 2021-01-22 | Disposition: A | Discharge status: Home / Self Care | Payer: Medicare Other | Source: Ambulatory Visit | Attending: Radiation Oncology | Admitting: Radiation Oncology

## 2021-01-22 DIAGNOSIS — C61 Malignant neoplasm of prostate: Secondary | ICD-10-CM | POA: Diagnosis not present

## 2021-01-23 ENCOUNTER — Ambulatory Visit
Admission: RE | Admit: 2021-01-23 | Discharge: 2021-01-23 | Disposition: A | Discharge status: Home / Self Care | Payer: Medicare Other | Source: Ambulatory Visit | Attending: Radiation Oncology | Admitting: Radiation Oncology

## 2021-01-23 DIAGNOSIS — C61 Malignant neoplasm of prostate: Secondary | ICD-10-CM | POA: Diagnosis not present

## 2021-01-23 NOTE — Progress Notes (Signed)
Pt here for patient teaching.    Pt given Radiation and You booklet and skin care instructions.   Reviewed areas of pertinence such as diarrhea, fatigue, hair loss, nausea and vomiting, sexual and fertility changes, skin changes and urinary and bladder changes .  Pt able to give teach back of to pat skin, use unscented/gentle soap, have Imodium on hand and drink plenty of water,avoid applying anything to skin within 4 hours of treatment.  Pt demonstrated understanding of information given and will contact nursing with any questions or concerns.    [email or web address]/index

## 2021-01-27 ENCOUNTER — Ambulatory Visit: Payer: Medicare Other

## 2021-01-27 ENCOUNTER — Ambulatory Visit
Admission: RE | Admit: 2021-01-27 | Discharge: 2021-01-27 | Disposition: A | Discharge status: Home / Self Care | Payer: Medicare Other | Source: Ambulatory Visit | Attending: Radiation Oncology | Admitting: Radiation Oncology

## 2021-01-27 ENCOUNTER — Other Ambulatory Visit: Payer: Self-pay

## 2021-01-27 DIAGNOSIS — C61 Malignant neoplasm of prostate: Secondary | ICD-10-CM | POA: Diagnosis not present

## 2021-01-28 ENCOUNTER — Ambulatory Visit
Admission: RE | Admit: 2021-01-28 | Discharge: 2021-01-28 | Disposition: A | Discharge status: Home / Self Care | Payer: Medicare Other | Source: Ambulatory Visit | Attending: Radiation Oncology | Admitting: Radiation Oncology

## 2021-01-28 ENCOUNTER — Ambulatory Visit: Payer: Medicare Other

## 2021-01-28 DIAGNOSIS — Z510 Encounter for antineoplastic radiation therapy: Secondary | ICD-10-CM | POA: Diagnosis not present

## 2021-01-28 DIAGNOSIS — C61 Malignant neoplasm of prostate: Secondary | ICD-10-CM | POA: Diagnosis not present

## 2021-01-29 ENCOUNTER — Ambulatory Visit: Payer: Medicare Other

## 2021-01-29 ENCOUNTER — Telehealth: Payer: Self-pay | Admitting: *Deleted

## 2021-01-29 ENCOUNTER — Ambulatory Visit
Admission: RE | Admit: 2021-01-29 | Discharge: 2021-01-29 | Disposition: A | Discharge status: Home / Self Care | Payer: Medicare Other | Source: Ambulatory Visit | Attending: Radiation Oncology | Admitting: Radiation Oncology

## 2021-01-29 ENCOUNTER — Other Ambulatory Visit: Payer: Self-pay

## 2021-01-29 DIAGNOSIS — C61 Malignant neoplasm of prostate: Secondary | ICD-10-CM | POA: Diagnosis not present

## 2021-01-29 DIAGNOSIS — Z510 Encounter for antineoplastic radiation therapy: Secondary | ICD-10-CM | POA: Diagnosis not present

## 2021-01-29 NOTE — Telephone Encounter (Signed)
CALLED PATIENT TO INFORM OF NUTRITION APPT. ON 02-17-21 @ 3:15 PM WITH JESSICA CLAYTON, SPOKE WITH PATIENT AND HE IS AWARE OF THIS APPT.

## 2021-01-30 ENCOUNTER — Ambulatory Visit: Payer: Medicare Other

## 2021-01-30 ENCOUNTER — Ambulatory Visit
Admission: RE | Admit: 2021-01-30 | Discharge: 2021-01-30 | Disposition: A | Discharge status: Home / Self Care | Payer: Medicare Other | Source: Ambulatory Visit | Attending: Radiation Oncology | Admitting: Radiation Oncology

## 2021-01-30 DIAGNOSIS — C61 Malignant neoplasm of prostate: Secondary | ICD-10-CM | POA: Diagnosis not present

## 2021-01-30 DIAGNOSIS — Z510 Encounter for antineoplastic radiation therapy: Secondary | ICD-10-CM | POA: Diagnosis not present

## 2021-02-02 ENCOUNTER — Other Ambulatory Visit: Payer: Self-pay

## 2021-02-02 ENCOUNTER — Ambulatory Visit
Admission: RE | Admit: 2021-02-02 | Discharge: 2021-02-02 | Disposition: A | Discharge status: Home / Self Care | Payer: Medicare Other | Source: Ambulatory Visit | Attending: Radiation Oncology | Admitting: Radiation Oncology

## 2021-02-02 DIAGNOSIS — C61 Malignant neoplasm of prostate: Secondary | ICD-10-CM | POA: Diagnosis not present

## 2021-02-02 DIAGNOSIS — Z510 Encounter for antineoplastic radiation therapy: Secondary | ICD-10-CM | POA: Diagnosis not present

## 2021-02-03 ENCOUNTER — Ambulatory Visit
Admission: RE | Admit: 2021-02-03 | Discharge: 2021-02-03 | Disposition: A | Discharge status: Home / Self Care | Payer: Medicare Other | Source: Ambulatory Visit | Attending: Radiation Oncology | Admitting: Radiation Oncology

## 2021-02-03 DIAGNOSIS — C61 Malignant neoplasm of prostate: Secondary | ICD-10-CM | POA: Diagnosis not present

## 2021-02-03 DIAGNOSIS — Z510 Encounter for antineoplastic radiation therapy: Secondary | ICD-10-CM | POA: Diagnosis not present

## 2021-02-04 ENCOUNTER — Other Ambulatory Visit: Payer: Self-pay

## 2021-02-04 ENCOUNTER — Ambulatory Visit
Admission: RE | Admit: 2021-02-04 | Discharge: 2021-02-04 | Disposition: A | Discharge status: Home / Self Care | Payer: Medicare Other | Source: Ambulatory Visit | Attending: Radiation Oncology | Admitting: Radiation Oncology

## 2021-02-04 DIAGNOSIS — Z510 Encounter for antineoplastic radiation therapy: Secondary | ICD-10-CM | POA: Diagnosis not present

## 2021-02-04 DIAGNOSIS — C61 Malignant neoplasm of prostate: Secondary | ICD-10-CM | POA: Diagnosis not present

## 2021-02-05 ENCOUNTER — Ambulatory Visit
Admission: RE | Admit: 2021-02-05 | Discharge: 2021-02-05 | Disposition: A | Discharge status: Home / Self Care | Payer: Medicare Other | Source: Ambulatory Visit | Attending: Radiation Oncology | Admitting: Radiation Oncology

## 2021-02-05 DIAGNOSIS — C61 Malignant neoplasm of prostate: Secondary | ICD-10-CM

## 2021-02-05 DIAGNOSIS — Z510 Encounter for antineoplastic radiation therapy: Secondary | ICD-10-CM | POA: Diagnosis not present

## 2021-02-05 MED ORDER — SONAFINE EX EMUL
1.0000 | Freq: Once | CUTANEOUS | Status: AC
Start: 2021-02-05 — End: 2021-02-05
  Administered 2021-02-05: 1 via TOPICAL

## 2021-02-06 ENCOUNTER — Other Ambulatory Visit: Payer: Self-pay

## 2021-02-06 ENCOUNTER — Ambulatory Visit
Admission: RE | Admit: 2021-02-06 | Discharge: 2021-02-06 | Disposition: A | Discharge status: Home / Self Care | Payer: Medicare Other | Source: Ambulatory Visit | Attending: Radiation Oncology | Admitting: Radiation Oncology

## 2021-02-06 DIAGNOSIS — C61 Malignant neoplasm of prostate: Secondary | ICD-10-CM | POA: Diagnosis not present

## 2021-02-06 DIAGNOSIS — Z510 Encounter for antineoplastic radiation therapy: Secondary | ICD-10-CM | POA: Diagnosis not present

## 2021-02-09 ENCOUNTER — Other Ambulatory Visit: Payer: Self-pay

## 2021-02-09 ENCOUNTER — Ambulatory Visit
Admission: RE | Admit: 2021-02-09 | Discharge: 2021-02-09 | Disposition: A | Discharge status: Home / Self Care | Payer: Medicare Other | Source: Ambulatory Visit | Attending: Radiation Oncology | Admitting: Radiation Oncology

## 2021-02-09 DIAGNOSIS — Z510 Encounter for antineoplastic radiation therapy: Secondary | ICD-10-CM | POA: Diagnosis not present

## 2021-02-09 DIAGNOSIS — C61 Malignant neoplasm of prostate: Secondary | ICD-10-CM | POA: Diagnosis not present

## 2021-02-10 ENCOUNTER — Ambulatory Visit
Admission: RE | Admit: 2021-02-10 | Discharge: 2021-02-10 | Disposition: A | Discharge status: Home / Self Care | Payer: Medicare Other | Source: Ambulatory Visit | Attending: Radiation Oncology | Admitting: Radiation Oncology

## 2021-02-10 DIAGNOSIS — C61 Malignant neoplasm of prostate: Secondary | ICD-10-CM | POA: Diagnosis not present

## 2021-02-10 DIAGNOSIS — Z510 Encounter for antineoplastic radiation therapy: Secondary | ICD-10-CM | POA: Diagnosis not present

## 2021-02-11 ENCOUNTER — Other Ambulatory Visit: Payer: Self-pay

## 2021-02-11 ENCOUNTER — Ambulatory Visit
Admission: RE | Admit: 2021-02-11 | Discharge: 2021-02-11 | Disposition: A | Discharge status: Home / Self Care | Payer: Medicare Other | Source: Ambulatory Visit | Attending: Radiation Oncology | Admitting: Radiation Oncology

## 2021-02-11 DIAGNOSIS — C61 Malignant neoplasm of prostate: Secondary | ICD-10-CM | POA: Diagnosis not present

## 2021-02-11 DIAGNOSIS — Z510 Encounter for antineoplastic radiation therapy: Secondary | ICD-10-CM | POA: Diagnosis not present

## 2021-02-12 ENCOUNTER — Ambulatory Visit
Admission: RE | Admit: 2021-02-12 | Discharge: 2021-02-12 | Disposition: A | Discharge status: Home / Self Care | Payer: Medicare Other | Source: Ambulatory Visit | Attending: Radiation Oncology | Admitting: Radiation Oncology

## 2021-02-12 ENCOUNTER — Other Ambulatory Visit: Payer: Self-pay

## 2021-02-12 DIAGNOSIS — Z510 Encounter for antineoplastic radiation therapy: Secondary | ICD-10-CM | POA: Diagnosis not present

## 2021-02-12 DIAGNOSIS — C61 Malignant neoplasm of prostate: Secondary | ICD-10-CM | POA: Diagnosis not present

## 2021-02-13 ENCOUNTER — Ambulatory Visit
Admission: RE | Admit: 2021-02-13 | Discharge: 2021-02-13 | Disposition: A | Discharge status: Home / Self Care | Payer: Medicare Other | Source: Ambulatory Visit | Attending: Radiation Oncology | Admitting: Radiation Oncology

## 2021-02-13 DIAGNOSIS — C61 Malignant neoplasm of prostate: Secondary | ICD-10-CM | POA: Diagnosis not present

## 2021-02-13 DIAGNOSIS — Z510 Encounter for antineoplastic radiation therapy: Secondary | ICD-10-CM | POA: Diagnosis not present

## 2021-02-16 ENCOUNTER — Other Ambulatory Visit: Payer: Self-pay

## 2021-02-16 ENCOUNTER — Ambulatory Visit
Admission: RE | Admit: 2021-02-16 | Discharge: 2021-02-16 | Disposition: A | Discharge status: Home / Self Care | Payer: Medicare Other | Source: Ambulatory Visit | Attending: Radiation Oncology | Admitting: Radiation Oncology

## 2021-02-16 DIAGNOSIS — C61 Malignant neoplasm of prostate: Secondary | ICD-10-CM | POA: Diagnosis not present

## 2021-02-16 DIAGNOSIS — Z510 Encounter for antineoplastic radiation therapy: Secondary | ICD-10-CM | POA: Diagnosis not present

## 2021-02-17 ENCOUNTER — Encounter: Payer: Medicare Other | Admitting: Dietician

## 2021-02-17 ENCOUNTER — Ambulatory Visit
Admission: RE | Admit: 2021-02-17 | Discharge: 2021-02-17 | Disposition: A | Discharge status: Home / Self Care | Payer: Medicare Other | Source: Ambulatory Visit | Attending: Radiation Oncology | Admitting: Radiation Oncology

## 2021-02-17 DIAGNOSIS — M48062 Spinal stenosis, lumbar region with neurogenic claudication: Secondary | ICD-10-CM | POA: Diagnosis not present

## 2021-02-17 DIAGNOSIS — Z510 Encounter for antineoplastic radiation therapy: Secondary | ICD-10-CM | POA: Diagnosis not present

## 2021-02-17 DIAGNOSIS — C61 Malignant neoplasm of prostate: Secondary | ICD-10-CM | POA: Diagnosis not present

## 2021-02-17 DIAGNOSIS — Z8546 Personal history of malignant neoplasm of prostate: Secondary | ICD-10-CM | POA: Diagnosis not present

## 2021-02-17 DIAGNOSIS — G894 Chronic pain syndrome: Secondary | ICD-10-CM | POA: Diagnosis not present

## 2021-02-17 NOTE — Progress Notes (Incomplete)
Nutrition Assessment   Reason for Assessment: ***   ASSESSMENT: 75 year old male with prostate cancer. He is s/p prostate seed implant boost on 3/25. Patient is receiving IMRT. Patient is followed by Dr. Tammi Klippel  Met with patient in clinic this afternoon. He reports tolerating radiation well. Denies nausea, vomiting, reports having intermittent diarrhea, constipation, and gas. Patient reports maintaining weights. He is eating plant-based diet, eats chicken, [place] but no red meat or dairy. Patient is going to the gym 3x/week. He complains of chronic back pain due to spinal stenosis, takes tramadol and tylenol as needed. Patient    Medications: Calcium 600+D3, MVI, Co Q10, Tramadol, Ocuvite, Grape Seed, Mg Oxide   Labs: 5/5 - Glucose 103, BUN 32, Cr 1.45   Anthropometrics: Patient reports intentional weight loss prior to treatment with dietary changes and increased physical activity secondary to side of effects of prolonged hormonal therapy (weight gain, decreased muscle mass, increased risk of DM, heart attack, stroke)  Height: 6" Weight: *** UBW: 190 lb BMI: ***    NUTRITION DIAGNOSIS: Food and nutrition knowledge related deficit related to cancer and related treatment as evidenced by no prior knowledge needed for related nutrition information   INTERVENTION:  Educated on the importance of adequate calories and protein to maintain weights, strength, nutrition  Discussed strategies for constipation and diarrhea, handouts provided Educated on recommendations for plant-based diet, eating whole foods, limiting red/processed meats, healthy weight, exercise - handout given Discussed support services available (support groups, yoga, reiki, [place] chi) calendar of events given Contact information provided, encouraged pt to contact with additional questions/nutrition concerns   MONITORING, EVALUATION, GOAL: Patient will tolerate adequate calories and protein to maintain weight   Next  Visit: No follow-up scheduled at this time

## 2021-02-18 ENCOUNTER — Other Ambulatory Visit: Payer: Self-pay

## 2021-02-18 ENCOUNTER — Ambulatory Visit
Admission: RE | Admit: 2021-02-18 | Discharge: 2021-02-18 | Disposition: A | Discharge status: Home / Self Care | Payer: Medicare Other | Source: Ambulatory Visit | Attending: Radiation Oncology | Admitting: Radiation Oncology

## 2021-02-18 DIAGNOSIS — Z510 Encounter for antineoplastic radiation therapy: Secondary | ICD-10-CM | POA: Diagnosis not present

## 2021-02-18 DIAGNOSIS — C61 Malignant neoplasm of prostate: Secondary | ICD-10-CM | POA: Diagnosis not present

## 2021-02-19 ENCOUNTER — Ambulatory Visit
Admission: RE | Admit: 2021-02-19 | Discharge: 2021-02-19 | Disposition: A | Discharge status: Home / Self Care | Payer: Medicare Other | Source: Ambulatory Visit | Attending: Radiation Oncology | Admitting: Radiation Oncology

## 2021-02-19 DIAGNOSIS — C61 Malignant neoplasm of prostate: Secondary | ICD-10-CM | POA: Diagnosis not present

## 2021-02-19 DIAGNOSIS — Z510 Encounter for antineoplastic radiation therapy: Secondary | ICD-10-CM | POA: Diagnosis not present

## 2021-02-20 ENCOUNTER — Ambulatory Visit
Admission: RE | Admit: 2021-02-20 | Discharge: 2021-02-20 | Disposition: A | Discharge status: Home / Self Care | Payer: Medicare Other | Source: Ambulatory Visit | Attending: Radiation Oncology | Admitting: Radiation Oncology

## 2021-02-20 ENCOUNTER — Other Ambulatory Visit: Payer: Self-pay

## 2021-02-20 DIAGNOSIS — Z510 Encounter for antineoplastic radiation therapy: Secondary | ICD-10-CM | POA: Diagnosis not present

## 2021-02-20 DIAGNOSIS — C61 Malignant neoplasm of prostate: Secondary | ICD-10-CM | POA: Diagnosis not present

## 2021-02-23 ENCOUNTER — Other Ambulatory Visit: Payer: Self-pay

## 2021-02-23 ENCOUNTER — Ambulatory Visit
Admission: RE | Admit: 2021-02-23 | Discharge: 2021-02-23 | Disposition: A | Discharge status: Home / Self Care | Payer: Medicare Other | Source: Ambulatory Visit | Attending: Radiation Oncology | Admitting: Radiation Oncology

## 2021-02-23 DIAGNOSIS — Z510 Encounter for antineoplastic radiation therapy: Secondary | ICD-10-CM | POA: Diagnosis not present

## 2021-02-23 DIAGNOSIS — C61 Malignant neoplasm of prostate: Secondary | ICD-10-CM | POA: Diagnosis not present

## 2021-02-24 ENCOUNTER — Ambulatory Visit
Admission: RE | Admit: 2021-02-24 | Discharge: 2021-02-24 | Disposition: A | Discharge status: Home / Self Care | Payer: Medicare Other | Source: Ambulatory Visit | Attending: Radiation Oncology | Admitting: Radiation Oncology

## 2021-02-24 DIAGNOSIS — Z510 Encounter for antineoplastic radiation therapy: Secondary | ICD-10-CM | POA: Diagnosis not present

## 2021-02-24 DIAGNOSIS — C61 Malignant neoplasm of prostate: Secondary | ICD-10-CM | POA: Diagnosis not present

## 2021-02-25 ENCOUNTER — Ambulatory Visit
Admission: RE | Admit: 2021-02-25 | Discharge: 2021-02-25 | Disposition: A | Discharge status: Home / Self Care | Payer: Medicare Other | Source: Ambulatory Visit | Attending: Radiation Oncology | Admitting: Radiation Oncology

## 2021-02-25 ENCOUNTER — Other Ambulatory Visit: Payer: Self-pay

## 2021-02-25 DIAGNOSIS — Z510 Encounter for antineoplastic radiation therapy: Secondary | ICD-10-CM | POA: Diagnosis not present

## 2021-02-25 DIAGNOSIS — C61 Malignant neoplasm of prostate: Secondary | ICD-10-CM | POA: Diagnosis not present

## 2021-02-26 ENCOUNTER — Ambulatory Visit: Payer: Medicare Other

## 2021-02-26 ENCOUNTER — Ambulatory Visit
Admission: RE | Admit: 2021-02-26 | Discharge: 2021-02-26 | Disposition: A | Discharge status: Home / Self Care | Payer: Medicare Other | Source: Ambulatory Visit | Attending: Radiation Oncology | Admitting: Radiation Oncology

## 2021-02-26 ENCOUNTER — Encounter: Payer: Self-pay | Admitting: Urology

## 2021-02-26 DIAGNOSIS — Z510 Encounter for antineoplastic radiation therapy: Secondary | ICD-10-CM | POA: Diagnosis not present

## 2021-02-26 DIAGNOSIS — C61 Malignant neoplasm of prostate: Secondary | ICD-10-CM | POA: Diagnosis not present

## 2021-02-27 ENCOUNTER — Ambulatory Visit: Payer: Medicare Other

## 2021-03-03 ENCOUNTER — Ambulatory Visit: Payer: Medicare Other

## 2021-03-04 ENCOUNTER — Ambulatory Visit: Payer: Medicare Other

## 2021-03-25 ENCOUNTER — Encounter (INDEPENDENT_AMBULATORY_CARE_PROVIDER_SITE_OTHER): Payer: Medicare Other | Admitting: Ophthalmology

## 2021-03-25 ENCOUNTER — Other Ambulatory Visit: Payer: Self-pay

## 2021-03-25 DIAGNOSIS — Z5111 Encounter for antineoplastic chemotherapy: Secondary | ICD-10-CM | POA: Diagnosis not present

## 2021-03-25 DIAGNOSIS — M069 Rheumatoid arthritis, unspecified: Secondary | ICD-10-CM | POA: Diagnosis not present

## 2021-03-25 DIAGNOSIS — H353112 Nonexudative age-related macular degeneration, right eye, intermediate dry stage: Secondary | ICD-10-CM | POA: Diagnosis not present

## 2021-03-25 DIAGNOSIS — H35711 Central serous chorioretinopathy, right eye: Secondary | ICD-10-CM

## 2021-03-25 DIAGNOSIS — C61 Malignant neoplasm of prostate: Secondary | ICD-10-CM | POA: Diagnosis not present

## 2021-03-25 DIAGNOSIS — D3131 Benign neoplasm of right choroid: Secondary | ICD-10-CM | POA: Diagnosis not present

## 2021-04-06 ENCOUNTER — Encounter: Payer: Self-pay | Admitting: Radiation Oncology

## 2021-04-06 ENCOUNTER — Ambulatory Visit
Admission: RE | Admit: 2021-04-06 | Discharge: 2021-04-06 | Disposition: A | Discharge status: Home / Self Care | Payer: Medicare Other | Source: Ambulatory Visit | Attending: Radiation Oncology | Admitting: Radiation Oncology

## 2021-04-06 DIAGNOSIS — Z510 Encounter for antineoplastic radiation therapy: Secondary | ICD-10-CM | POA: Diagnosis not present

## 2021-04-06 DIAGNOSIS — C61 Malignant neoplasm of prostate: Secondary | ICD-10-CM | POA: Insufficient documentation

## 2021-04-08 ENCOUNTER — Encounter: Payer: Self-pay | Admitting: Urology

## 2021-04-08 NOTE — Progress Notes (Signed)
Patient states no fatigue or pain related to treatment but has a history of lumbar pain (2/10). Patient reports some mild diarrhea post constipation. No hematuria but is experiencing some dysuria, nocturia x3-4, frequency, urgency, straining, and a weak urine stream, that's intermittent, with what feels to be some residual post urination. Skin is intact but has gotten a recent sunburn type reaction, post being in the sun without any sunscreen. Advised patient to limit sun exposure, wear a hat that provides shade and to always use sunscreen.  I-PSS score of 20 Meaningful use questions complete. Patient notified of his telephone appointment on 04/09/21 @ 2:00pm and expressed an understanding of it.

## 2021-04-09 ENCOUNTER — Ambulatory Visit
Admission: RE | Admit: 2021-04-09 | Discharge: 2021-04-09 | Disposition: A | Discharge status: Home / Self Care | Payer: BLUE CROSS/BLUE SHIELD | Source: Ambulatory Visit | Attending: Urology | Admitting: Urology

## 2021-04-09 DIAGNOSIS — C61 Malignant neoplasm of prostate: Secondary | ICD-10-CM

## 2021-04-09 HISTORY — DX: Idiopathic urticaria: L50.1

## 2021-04-09 NOTE — Progress Notes (Signed)
Radiation Oncology         [phone] ________________________________  Name: CALLOWAY LISZKA MRN: [id number]  Date: 04/09/2021  DOB: 22-Mar-1946  Post Treatment Note  CC: Carl Flores, [name], MD  Franchot Gallo, MD  Diagnosis:   75 y.o. gentleman with stage T1c adenocarcinoma of the prostate with a Gleason's score of 4+4 and a PSA of 7.9     Interval Since Last Radiation:  6 weeks   01/02/21:  Insertion of radioactive I-125 seeds into the prostate gland; 110 Gy, boost therapy. 01/22/21 - 02/26/21: The prostate and pelvic nodes were treated to  45 Gy in 25 fractions of 1.8 Gy, to supplement an up-front prostate seed implant boost of 110 Gy to achieve a total nominal dose of 155 Gy.  Narrative:  I spoke with the patient to conduct his routine scheduled 1 month follow up visit via telephone to spare the patient unnecessary potential exposure in the healthcare setting during the current COVID-19 pandemic.  The patient was notified in advance and gave permission to proceed with this visit format.  He tolerated radiation treatment relatively well with only minor urinary irritation and modest fatigue.  He experienced dysuria, increased frequency, urgency, nocturia 4-5 times per night, weak stream and intermittency which were all managed with Uroxatrol.  He also reported alternating constipation and loose stools but denied abdominal pain or outright diarrhea.  He also experienced bothersome hot flashes associated with his ADT.                              On review of systems, the patient states that he continues with occasional mild dysuria, nocturia 3-4 times per night, increased frequency, urgency, weak stream, hesitancy and intermittency.  His current IPSS score is 20, indicating severe urinary symptoms despite taking Uroxatrol daily as prescribed.  He has noticed some improvement in his energy level but continues with mild fatigue and hot flashes.  He reports a healthy appetite and is maintaining  his weight.  He denies abdominal pain, nausea or vomiting but continues with some alternating constipation and loose stools.  Overall, he is pleased with his progress to date.  ALLERGIES:  is allergic to cephalexin, erythromycin, prednisone, and sulfa antibiotics.  Meds: Current Outpatient Medications  Medication Sig Dispense Refill   acetaminophen (TYLENOL) 325 MG tablet Take 650 mg by mouth every 6 (six) hours as needed.     Bioflavonoid Products (GRAPE SEED PO) Take 1 fluid ounce by mouth daily.     Calcium Carb-Cholecalciferol (CALCIUM 600+D3 PO) Take 1 capsule by mouth daily.     Coenzyme Q10 (CO Q-10) 100 MG CAPS Take 1 capsule by mouth daily.     Ginkgo Biloba 120 MG TABS Take 120 mg by mouth daily.     Glucosamine-Chondroit-Vit C-Mn (GLUCOSAMINE 1500 COMPLEX) CAPS Take 1 capsule by mouth daily in the afternoon.     hydroxychloroquine (PLAQUENIL) 200 MG tablet Take 150 mg by mouth daily.     levocetirizine (XYZAL) 5 MG tablet Take 5 mg by mouth every evening.     MAGNESIUM OXIDE PO Take 800 mg by mouth daily.     Misc Natural Products (PROSTATE HEALTH PO) daily.     Misc Natural Products (TART CHERRY ADVANCED PO) Take 1 capsule by mouth daily.     Multiple Vitamins-Minerals (OCUVITE PO) Take 1 tablet by mouth daily.     Multiple Vitamins-Minerals (ONE-A-DAY MENS 50+ ADVANTAGE PO) Take by mouth  daily.     OVER THE COUNTER MEDICATION daily. ZYFLAFEMD SUPPLEMENT     traMADol (ULTRAM) 50 MG tablet Take 25-50 mg by mouth every 6 (six) hours as needed.     aspirin 325 MG tablet Take 71 mg by mouth daily. (Patient not taking: Reported on 04/08/2021)     doxycycline (VIBRA-TABS) 100 MG tablet Take 1 tablet (100 mg total) by mouth 2 (two) times daily. 6 tablet 0   doxycycline (VIBRAMYCIN) 100 MG capsule Take 1 capsule (100 mg total) by mouth 2 (two) times daily. 10 capsule 0   No current facility-administered medications for this encounter.    Physical Findings:  vitals were not taken for  this visit.  Pain Assessment Pain Score: 2  (Lumbar)/10 Unable to assess due to telephone follow-up visit format.  Lab Findings: Lab Results  Component Value Date   WBC 3.2 (L) 01/01/2021   HGB 13.1 01/01/2021   HCT 39.9 01/01/2021   MCV 96.6 01/01/2021   PLT 151 01/01/2021     Radiographic Findings: No results found.  Impression/Plan: 1. 75 y.o. gentleman with stage T1c adenocarcinoma of the prostate with a Gleason's score of 4+4 and a PSA of 7.9. He will continue to follow up with urology for ongoing PSA determinations and has an appointment scheduled with Dr. Diona Fanti for labs on 07/27/2021, when he will be due for his next Eligard ADT injection.  At the time of his most recent follow-up visit with Dr. Diona Fanti on 03/25/2021, he was started on Megace to help with the bothersome hot flashes associated with his ADT.  He understands what to expect with regards to PSA monitoring going forward. I will look forward to following his response to treatment via correspondence with urology, and would be happy to continue to participate in his care if clinically indicated. I talked to the patient about what to expect in the future, including his risk for erectile dysfunction and rectal bleeding. I encouraged him to call or return to the office if he has any questions regarding his previous radiation or possible radiation side effects. He was comfortable with this plan and will follow up as needed.      Nicholos Johns, PA-C

## 2021-04-09 NOTE — Progress Notes (Signed)
  Radiation Oncology         [phone] ________________________________  Name: Carl Flores MRN: [id number]  Date: 02/26/2021  DOB: [birth year]  End of Treatment Note  Diagnosis:   75 y.o. gentleman with stage T1c adenocarcinoma of the prostate with a Gleason's score of 4+4 and a PSA of 7.9     Indication for treatment:  Curative, Definitive Radiotherapy concurrent with LT-ADT (Eligard ADT started 10/22/20)  Radiation treatment dates:     01/02/21: Brachytherapy seed boost 01/22/21 - 02/26/21: IMRT prostate and pelvic nodes  Site/dose:   Insertion of radioactive I-125 seeds into the prostate gland; 110 Gy, boost therapy. The prostate and pelvic nodes were treated to  45 Gy in 25 fractions of 1.8 Gy, to supplement an up-front prostate seed implant boost of 110 Gy to achieve a total nominal dose of 155 Gy.  Beams/energy:  The prostate, seminal vesicles, and pelvic lymph nodes were treated using VMAT intensity modulated radiotherapy delivering 6 megavolt photons. Image guidance was performed with CB-CT studies prior to each fraction. He was immobilized with a body fix lower extremity mold.   Narrative: The patient tolerated radiation treatment relatively well with only minor urinary irritation and modest fatigue.  He experienced dysuria, increased frequency, urgency, nocturia 4-5 times per night, weak stream and intermittency which were all managed with Uroxatrol.  He also reported alternating constipation and loose stools but denied abdominal pain or outright diarrhea.  He also experienced bothersome hot flashes associated with his ADT.  Plan: The patient has completed radiation treatment. He will return to radiation oncology clinic for routine followup in one month. I advised him to call or return sooner if he has any questions or concerns related to his recovery or treatment. ________________________________  Sheral Apley. Tammi Klippel, M.D.

## 2021-04-13 ENCOUNTER — Encounter (INDEPENDENT_AMBULATORY_CARE_PROVIDER_SITE_OTHER): Payer: Medicare Other | Admitting: Ophthalmology

## 2021-04-16 ENCOUNTER — Encounter (INDEPENDENT_AMBULATORY_CARE_PROVIDER_SITE_OTHER): Payer: Medicare Other | Admitting: Ophthalmology

## 2021-04-16 ENCOUNTER — Other Ambulatory Visit: Payer: Self-pay

## 2021-04-16 DIAGNOSIS — H353112 Nonexudative age-related macular degeneration, right eye, intermediate dry stage: Secondary | ICD-10-CM | POA: Diagnosis not present

## 2021-04-16 DIAGNOSIS — H43813 Vitreous degeneration, bilateral: Secondary | ICD-10-CM | POA: Diagnosis not present

## 2021-04-16 DIAGNOSIS — H20012 Primary iridocyclitis, left eye: Secondary | ICD-10-CM

## 2021-04-16 DIAGNOSIS — D3131 Benign neoplasm of right choroid: Secondary | ICD-10-CM

## 2021-04-17 DIAGNOSIS — H35711 Central serous chorioretinopathy, right eye: Secondary | ICD-10-CM | POA: Diagnosis not present

## 2021-04-17 DIAGNOSIS — H209 Unspecified iridocyclitis: Secondary | ICD-10-CM | POA: Diagnosis not present

## 2021-04-17 DIAGNOSIS — Z961 Presence of intraocular lens: Secondary | ICD-10-CM | POA: Diagnosis not present

## 2021-04-17 DIAGNOSIS — D3131 Benign neoplasm of right choroid: Secondary | ICD-10-CM | POA: Diagnosis not present

## 2021-04-17 DIAGNOSIS — T8579XA Infection and inflammatory reaction due to other internal prosthetic devices, implants and grafts, initial encounter: Secondary | ICD-10-CM | POA: Diagnosis not present

## 2021-04-20 NOTE — Progress Notes (Signed)
  Radiation Oncology         [phone]) [phone] ________________________________  Name: Carl Flores MRN: [id number]  Date: 04/06/2021  DOB: [birth year]  3D Planning Note   Prostate Brachytherapy Post-Implant Dosimetry  Diagnosis: 75 y.o. gentleman with stage T1c adenocarcinoma of the prostate with a Gleason's score of 4+4 and a PSA of 7.9  Narrative: On a previous date, Carl Flores returned following prostate seed implantation for post implant planning. He underwent CT scan complex simulation to delineate the three-dimensional structures of the pelvis and demonstrate the radiation distribution.  Since that time, the seed localization, and complex isodose planning with dose volume histograms have now been completed.  Results:   Prostate Coverage - The dose of radiation delivered to the 90% or more of the prostate gland (D90) was 104.46% of the prescription dose. This exceeds our goal of greater than 90%. Rectal Sparing - The volume of rectal tissue receiving the prescription dose or higher was 0.12 cc. This falls under our thresholds tolerance of 1.0 cc.  Impression: The prostate seed implant appears to show adequate target coverage and appropriate rectal sparing.  Plan:  The patient will continue to follow with urology for ongoing PSA determinations. I would anticipate a high likelihood for local tumor control with minimal risk for rectal morbidity.  ________________________________  Sheral Apley Tammi Klippel, M.D.

## 2021-04-22 ENCOUNTER — Other Ambulatory Visit: Payer: Self-pay | Admitting: Urology

## 2021-04-22 DIAGNOSIS — T8579XA Infection and inflammatory reaction due to other internal prosthetic devices, implants and grafts, initial encounter: Secondary | ICD-10-CM | POA: Diagnosis not present

## 2021-04-22 DIAGNOSIS — H353111 Nonexudative age-related macular degeneration, right eye, early dry stage: Secondary | ICD-10-CM | POA: Diagnosis not present

## 2021-04-22 DIAGNOSIS — H209 Unspecified iridocyclitis: Secondary | ICD-10-CM | POA: Diagnosis not present

## 2021-04-22 DIAGNOSIS — D3131 Benign neoplasm of right choroid: Secondary | ICD-10-CM | POA: Diagnosis not present

## 2021-04-22 DIAGNOSIS — C61 Malignant neoplasm of prostate: Secondary | ICD-10-CM

## 2021-04-22 DIAGNOSIS — Z961 Presence of intraocular lens: Secondary | ICD-10-CM | POA: Diagnosis not present

## 2021-04-22 DIAGNOSIS — H35711 Central serous chorioretinopathy, right eye: Secondary | ICD-10-CM | POA: Diagnosis not present

## 2021-05-07 DIAGNOSIS — Z23 Encounter for immunization: Secondary | ICD-10-CM | POA: Diagnosis not present

## 2021-05-15 DIAGNOSIS — Z23 Encounter for immunization: Secondary | ICD-10-CM | POA: Diagnosis not present

## 2021-05-26 ENCOUNTER — Telehealth: Payer: Self-pay | Admitting: Adult Health

## 2021-05-26 NOTE — Telephone Encounter (Signed)
LMOM with patient to discuss the referral we received to review his cancer survivorship care plan visit.  I asked that he return my call at his earliest convenience so that we could discuss further.    Carl Bihari, NP

## 2021-05-30 DIAGNOSIS — Z20822 Contact with and (suspected) exposure to covid-19: Secondary | ICD-10-CM | POA: Diagnosis not present

## 2021-06-03 DIAGNOSIS — H209 Unspecified iridocyclitis: Secondary | ICD-10-CM | POA: Diagnosis not present

## 2021-06-03 DIAGNOSIS — H35711 Central serous chorioretinopathy, right eye: Secondary | ICD-10-CM | POA: Diagnosis not present

## 2021-06-03 DIAGNOSIS — D3131 Benign neoplasm of right choroid: Secondary | ICD-10-CM | POA: Diagnosis not present

## 2021-06-03 DIAGNOSIS — H353111 Nonexudative age-related macular degeneration, right eye, early dry stage: Secondary | ICD-10-CM | POA: Diagnosis not present

## 2021-06-03 DIAGNOSIS — T8579XA Infection and inflammatory reaction due to other internal prosthetic devices, implants and grafts, initial encounter: Secondary | ICD-10-CM | POA: Diagnosis not present

## 2021-06-03 DIAGNOSIS — Z961 Presence of intraocular lens: Secondary | ICD-10-CM | POA: Diagnosis not present

## 2021-06-08 DIAGNOSIS — H35711 Central serous chorioretinopathy, right eye: Secondary | ICD-10-CM | POA: Diagnosis not present

## 2021-06-08 DIAGNOSIS — Z961 Presence of intraocular lens: Secondary | ICD-10-CM | POA: Diagnosis not present

## 2021-06-08 DIAGNOSIS — D3131 Benign neoplasm of right choroid: Secondary | ICD-10-CM | POA: Diagnosis not present

## 2021-06-08 DIAGNOSIS — H353111 Nonexudative age-related macular degeneration, right eye, early dry stage: Secondary | ICD-10-CM | POA: Diagnosis not present

## 2021-06-08 DIAGNOSIS — H209 Unspecified iridocyclitis: Secondary | ICD-10-CM | POA: Diagnosis not present

## 2021-06-08 DIAGNOSIS — T8579XA Infection and inflammatory reaction due to other internal prosthetic devices, implants and grafts, initial encounter: Secondary | ICD-10-CM | POA: Diagnosis not present

## 2021-06-16 DIAGNOSIS — I700 Atherosclerosis of aorta: Secondary | ICD-10-CM | POA: Diagnosis not present

## 2021-06-16 DIAGNOSIS — Z0000 Encounter for general adult medical examination without abnormal findings: Secondary | ICD-10-CM | POA: Diagnosis not present

## 2021-06-16 DIAGNOSIS — N183 Chronic kidney disease, stage 3 unspecified: Secondary | ICD-10-CM | POA: Diagnosis not present

## 2021-06-22 DIAGNOSIS — G894 Chronic pain syndrome: Secondary | ICD-10-CM | POA: Diagnosis not present

## 2021-06-23 DIAGNOSIS — H209 Unspecified iridocyclitis: Secondary | ICD-10-CM | POA: Diagnosis not present

## 2021-06-23 DIAGNOSIS — Z961 Presence of intraocular lens: Secondary | ICD-10-CM | POA: Diagnosis not present

## 2021-06-23 DIAGNOSIS — T8579XA Infection and inflammatory reaction due to other internal prosthetic devices, implants and grafts, initial encounter: Secondary | ICD-10-CM | POA: Diagnosis not present

## 2021-06-30 DIAGNOSIS — Z20822 Contact with and (suspected) exposure to covid-19: Secondary | ICD-10-CM | POA: Diagnosis not present

## 2021-07-10 DIAGNOSIS — L508 Other urticaria: Secondary | ICD-10-CM | POA: Diagnosis not present

## 2021-07-10 DIAGNOSIS — L509 Urticaria, unspecified: Secondary | ICD-10-CM | POA: Diagnosis not present

## 2021-07-16 ENCOUNTER — Inpatient Hospital Stay: Payer: Medicare Other | Attending: Urology | Admitting: *Deleted

## 2021-07-16 ENCOUNTER — Encounter: Payer: Self-pay | Admitting: *Deleted

## 2021-07-16 ENCOUNTER — Other Ambulatory Visit: Payer: Self-pay

## 2021-07-16 VITALS — BP 152/74 | HR 67 | Temp 98.0°F | Resp 18 | Ht 71.0 in | Wt 185.1 lb

## 2021-07-16 DIAGNOSIS — C61 Malignant neoplasm of prostate: Secondary | ICD-10-CM

## 2021-07-16 NOTE — Progress Notes (Signed)
  SCP reviewed and completed. SDOH assessed and completed. Pt continues to exercise 5-6 x a week for 90 minutes at the gym. Pt discussed the desire to hopefully be able to stop the Eligard and start on an oral agent that can do the same thing. He has a lot of hot flashes. Pt has spinal stenosis usually pain 5/10. He takes Tylenol and tramadol. Pt has increased frequency , urgency and nocturia 3-6 x nightly sometimes. Pt had last colonoscopy in 2021,he does have a hx of polyps. Bowels tend to be on the looser side but not diarrhea. Pt had flu vaccine 9/22 at [facility]. No add'l Covid boosters.

## 2021-07-20 ENCOUNTER — Ambulatory Visit: Payer: Medicare Other

## 2021-07-21 ENCOUNTER — Ambulatory Visit: Payer: Medicare Other

## 2021-07-21 DIAGNOSIS — H209 Unspecified iridocyclitis: Secondary | ICD-10-CM | POA: Diagnosis not present

## 2021-07-21 DIAGNOSIS — D3131 Benign neoplasm of right choroid: Secondary | ICD-10-CM | POA: Diagnosis not present

## 2021-07-21 DIAGNOSIS — T8579XA Infection and inflammatory reaction due to other internal prosthetic devices, implants and grafts, initial encounter: Secondary | ICD-10-CM | POA: Diagnosis not present

## 2021-07-21 DIAGNOSIS — Z961 Presence of intraocular lens: Secondary | ICD-10-CM | POA: Diagnosis not present

## 2021-07-21 DIAGNOSIS — H35711 Central serous chorioretinopathy, right eye: Secondary | ICD-10-CM | POA: Diagnosis not present

## 2021-07-21 DIAGNOSIS — H353110 Nonexudative age-related macular degeneration, right eye, stage unspecified: Secondary | ICD-10-CM | POA: Diagnosis not present

## 2021-07-22 ENCOUNTER — Ambulatory Visit: Payer: Medicare Other

## 2021-07-27 DIAGNOSIS — N401 Enlarged prostate with lower urinary tract symptoms: Secondary | ICD-10-CM | POA: Diagnosis not present

## 2021-07-27 DIAGNOSIS — N5201 Erectile dysfunction due to arterial insufficiency: Secondary | ICD-10-CM | POA: Diagnosis not present

## 2021-07-27 DIAGNOSIS — C61 Malignant neoplasm of prostate: Secondary | ICD-10-CM | POA: Diagnosis not present

## 2021-07-27 DIAGNOSIS — D3501 Benign neoplasm of right adrenal gland: Secondary | ICD-10-CM | POA: Diagnosis not present

## 2021-07-27 DIAGNOSIS — R3915 Urgency of urination: Secondary | ICD-10-CM | POA: Diagnosis not present

## 2021-09-09 DIAGNOSIS — R0781 Pleurodynia: Secondary | ICD-10-CM | POA: Diagnosis not present

## 2021-10-07 DIAGNOSIS — H35711 Central serous chorioretinopathy, right eye: Secondary | ICD-10-CM | POA: Diagnosis not present

## 2021-10-07 DIAGNOSIS — H0102B Squamous blepharitis left eye, upper and lower eyelids: Secondary | ICD-10-CM | POA: Diagnosis not present

## 2021-10-07 DIAGNOSIS — H209 Unspecified iridocyclitis: Secondary | ICD-10-CM | POA: Diagnosis not present

## 2021-10-07 DIAGNOSIS — D3131 Benign neoplasm of right choroid: Secondary | ICD-10-CM | POA: Diagnosis not present

## 2021-10-07 DIAGNOSIS — H0102A Squamous blepharitis right eye, upper and lower eyelids: Secondary | ICD-10-CM | POA: Diagnosis not present

## 2021-10-07 DIAGNOSIS — H353111 Nonexudative age-related macular degeneration, right eye, early dry stage: Secondary | ICD-10-CM | POA: Diagnosis not present

## 2021-10-07 DIAGNOSIS — T8579XA Infection and inflammatory reaction due to other internal prosthetic devices, implants and grafts, initial encounter: Secondary | ICD-10-CM | POA: Diagnosis not present

## 2021-10-07 DIAGNOSIS — Z961 Presence of intraocular lens: Secondary | ICD-10-CM | POA: Diagnosis not present

## 2021-10-07 DIAGNOSIS — H0015 Chalazion left lower eyelid: Secondary | ICD-10-CM | POA: Diagnosis not present

## 2021-10-28 DIAGNOSIS — R194 Change in bowel habit: Secondary | ICD-10-CM | POA: Diagnosis not present

## 2021-10-29 DIAGNOSIS — G894 Chronic pain syndrome: Secondary | ICD-10-CM | POA: Diagnosis not present

## 2021-10-29 DIAGNOSIS — Z5181 Encounter for therapeutic drug level monitoring: Secondary | ICD-10-CM | POA: Diagnosis not present

## 2021-10-29 DIAGNOSIS — Z79891 Long term (current) use of opiate analgesic: Secondary | ICD-10-CM | POA: Diagnosis not present

## 2021-11-02 DIAGNOSIS — C61 Malignant neoplasm of prostate: Secondary | ICD-10-CM | POA: Diagnosis not present

## 2021-11-09 DIAGNOSIS — C61 Malignant neoplasm of prostate: Secondary | ICD-10-CM | POA: Diagnosis not present

## 2021-11-09 DIAGNOSIS — N401 Enlarged prostate with lower urinary tract symptoms: Secondary | ICD-10-CM | POA: Diagnosis not present

## 2021-11-09 DIAGNOSIS — R3915 Urgency of urination: Secondary | ICD-10-CM | POA: Diagnosis not present

## 2021-11-13 DIAGNOSIS — D485 Neoplasm of uncertain behavior of skin: Secondary | ICD-10-CM | POA: Diagnosis not present

## 2021-11-13 DIAGNOSIS — L821 Other seborrheic keratosis: Secondary | ICD-10-CM | POA: Diagnosis not present

## 2021-12-25 IMAGING — MR MR THORACIC SPINE WO/W CM
11 of 16 series · 29 of 48 positions shown · IV contrast (with contrast)
Comparison: Whole body bone scan 07/21/2020

CLINICAL DATA: Prostate cancer with rising PSA. Abnormal bone scan.
Rule out metastatic disease.

EXAM:
MRI THORACIC AND LUMBAR SPINE WITHOUT AND WITH CONTRAST
TECHNIQUE: Multiplanar and multiecho pulse sequences of the thoracic and lumbar
spine were obtained without and with intravenous contrast.
CONTRAST:  8mL GADAVIST GADOBUTROL 1 MMOL/ML IV SOLN

[Series 16: T1 · sagittal · 4.0mm · 1.72mm/px · 1 of 5 slices shown (1 of 5)]
[im 1/5]
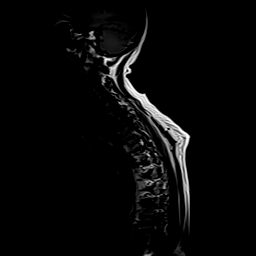

[Series 17: T2 · sagittal · 3.0mm · 0.86mm/px · 1 of 18 slices shown (1 of 3)]
[im 1/18]
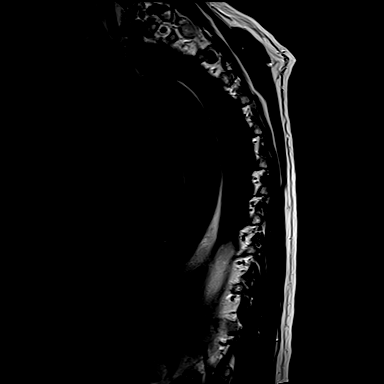

[Series 19: T1 · sagittal · 3.0mm · 1.03mm/px · 1 of 15 slices shown (2 of 5)]
[im 1/15]
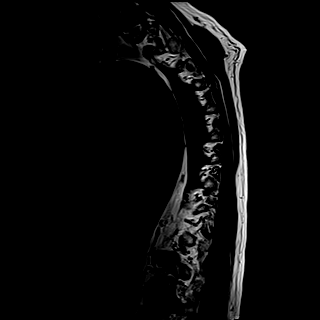

[Series 20: T2 · axial · 4.0mm · 0.78mm/px · z∈[-267,-37]mm · 4 of 43 slices shown (2 of 3)]
[im 1/43]
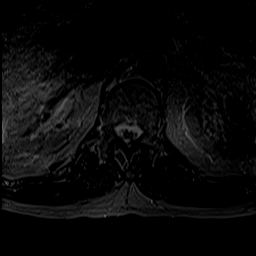
[im 15/43]
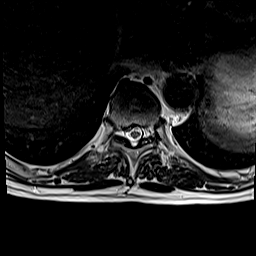
[im 29/43]
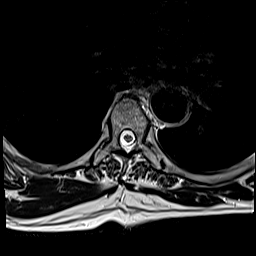
[im 43/43]
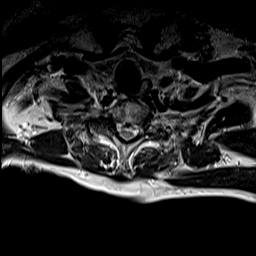

[Series 22: T1 · axial · 4.0mm · 0.39mm/px · z∈[-267,-37]mm · 5 of 43 slices shown (3 of 5)]
[im 1/43]
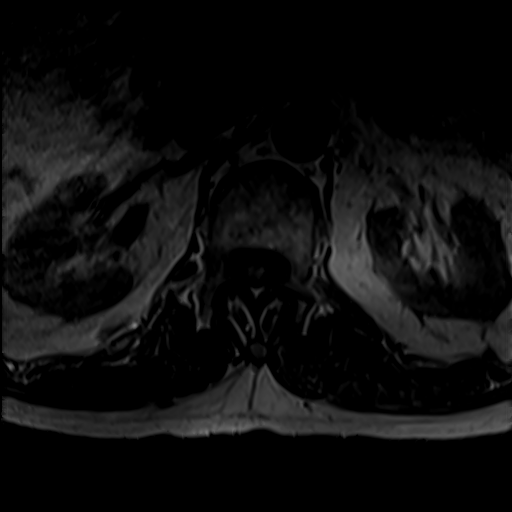
[im 11/43]
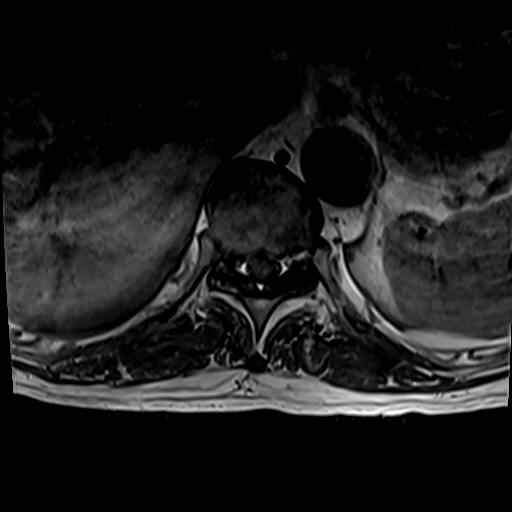
[im 22/43]
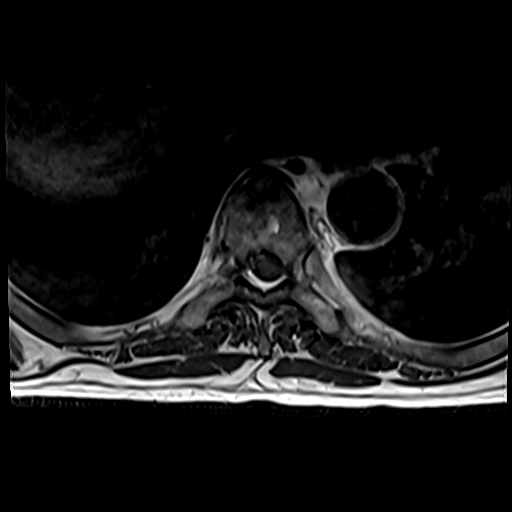
[im 32/43]
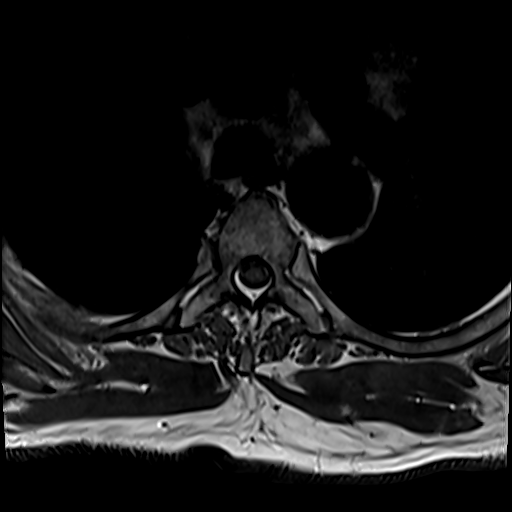
[im 43/43]
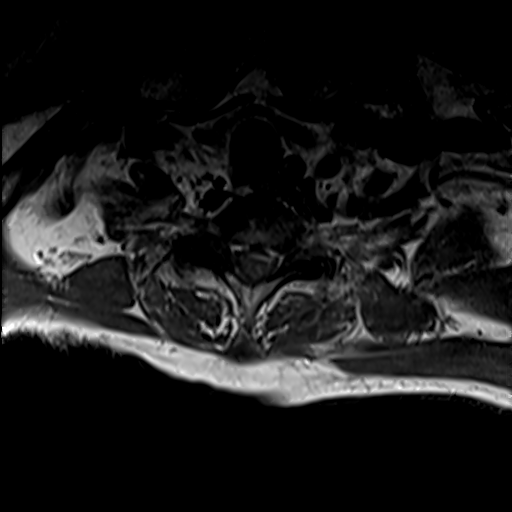

[Series 23: T1 · sagittal · 4.0mm · 0.81mm/px · 2 of 15 slices shown (4 of 5)]
[im 1/15]
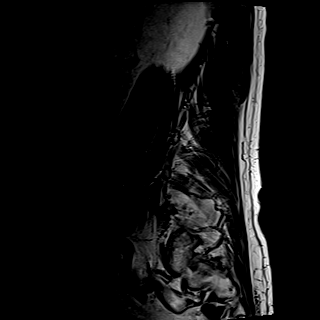
[im 15/15]
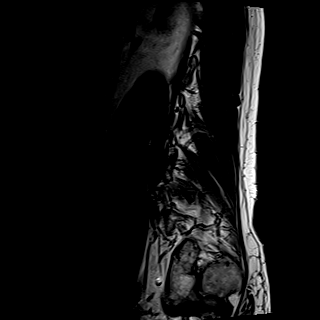

[Series 25: T2 · axial · 4.0mm · 0.62mm/px · z∈[-486,-258]mm · 5 of 41 slices shown (3 of 3)]
[im 1/41]
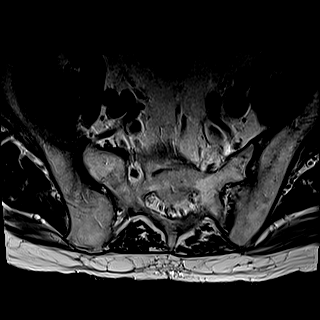
[im 11/41]
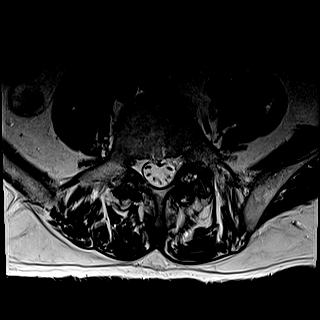
[im 21/41]
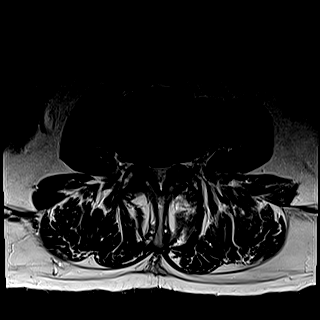
[im 31/41]
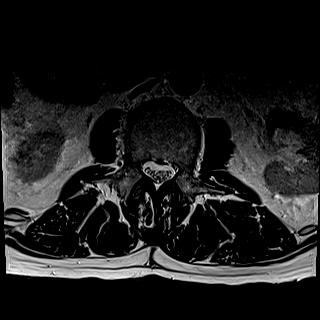
[im 41/41]
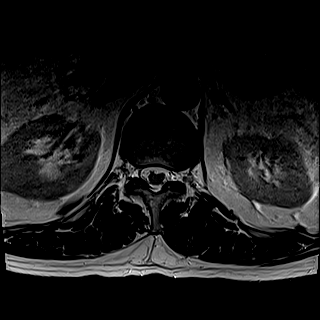

[Series 26: T1 · axial · 4.0mm · 0.39mm/px · z∈[-486,-258]mm · 5 of 41 slices shown (5 of 5)]
[im 1/41]
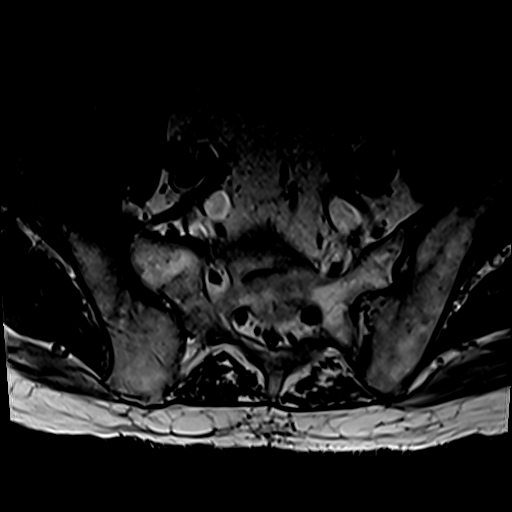
[im 11/41]
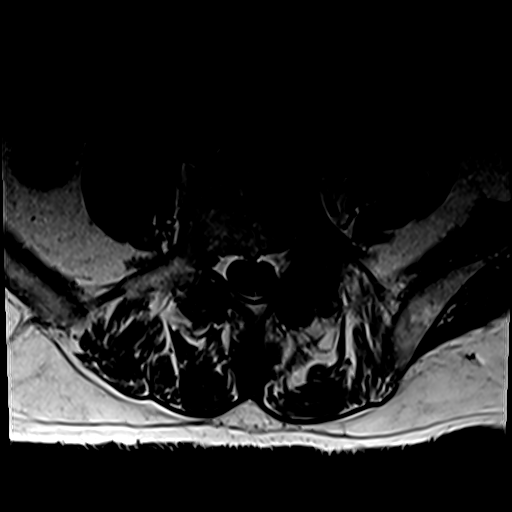
[im 21/41]
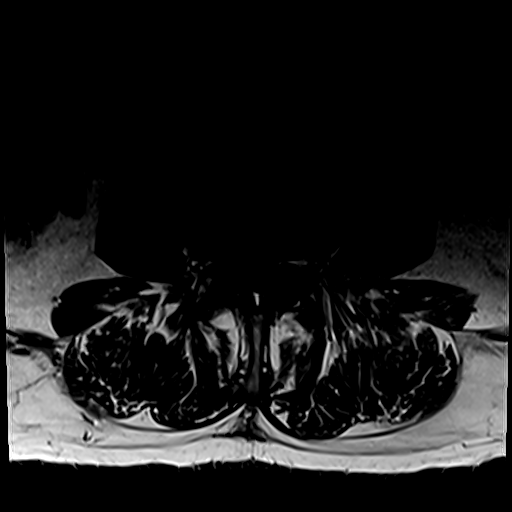
[im 31/41]
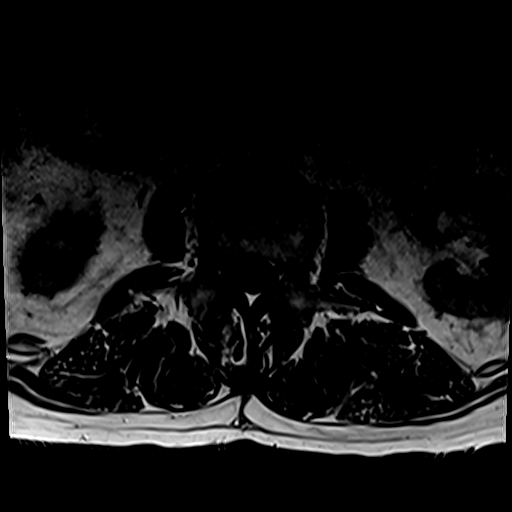
[im 41/41]
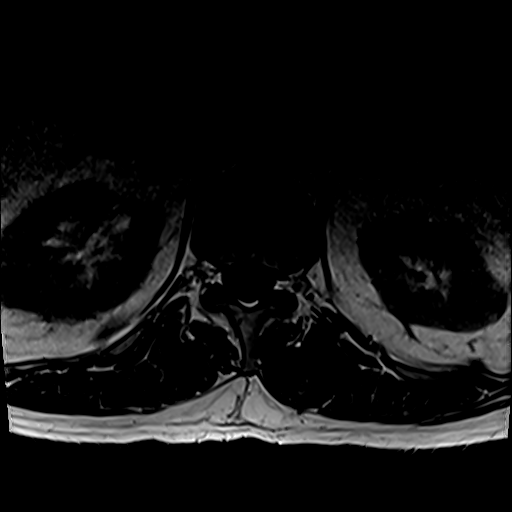

[Series 27: T2 post-contrast · sagittal · 4.0mm · 0.81mm/px · 2 of 15 slices shown]
[im 1/15]
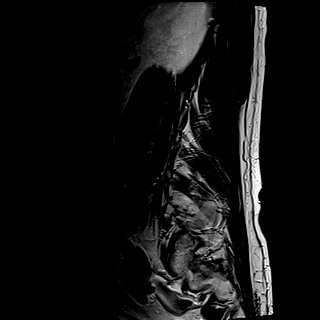
[im 15/15]
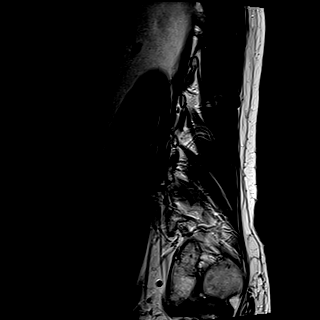

[Series 28: T1 fat-sat post-contrast · sagittal · 4.0mm · 0.81mm/px · 2 of 15 slices shown (1 of 2)]
[im 1/15]
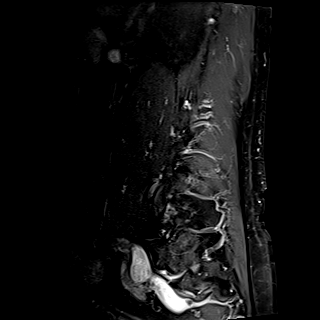
[im 15/15]
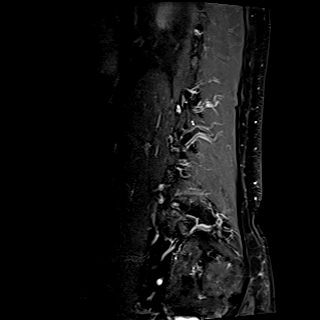

[Series 30: T1 fat-sat post-contrast · sagittal · 3.0mm · 1.00mm/px · 1 of 18 slices shown (2 of 2)]
[im 1/18]
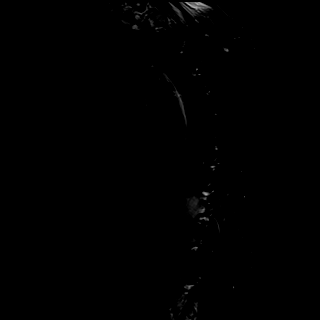

[29 of 48 positions shown; findings below may reference images not displayed]

FINDINGS: MRI THORACIC SPINE FINDINGS

Alignment: 4 mm anterolisthesis C7-T1 related to facet and disc
degeneration. 2 mm anterolisthesis T2-3. Remaining alignment normal.
Mild dextroscoliosis.

Vertebrae: Negative for fracture. No lesions are seen suspicious for
metastatic disease. Disc degeneration and spurring asymmetric on the
left at T7-8 corresponds to bone scan activity. No enhancing bone
marrow lesion identified.

Cord:  Normal signal and morphology.

Paraspinal and other soft tissues: Negative for paraspinous mass,
adenopathy, or fluid collection.

Disc levels:

C7-T1: 4 mm anterolisthesis. Extensive disc and facet degeneration.
Moderate spinal and foraminal stenosis bilaterally.

T1-2: Mild disc degeneration

T2-3: 2 mm anterolisthesis. Bilateral facet hypertrophy. Mild
foraminal stenosis bilaterally.

T3-4: Negative

T4-5: Negative

T5-6: Negative

T6-7: Negative

T7-8: Disc degeneration with disc space narrowing and spurring,
asymmetric on the left. Mild facet degeneration. Mild spinal
stenosis.

T8-9: Small right-sided disc protrusion without significant stenosis

T9-10: Diffuse bulging of the disc and mild facet degeneration.
Moderate right foraminal stenosis and mild left foraminal stenosis.

T10-11: Bilateral facet degeneration.  Negative for stenosis

T11-12: Bilateral facet degeneration. Mild foraminal stenosis
bilaterally

T12-L1: Mild facet degeneration.  Negative for stenosis.

MRI LUMBAR SPINE FINDINGS

Segmentation:  Normal.  Lowest disc space L5-S1

Alignment:  Mild retrolisthesis L2-3.  Mild anterolisthesis L4-5.

Vertebrae: Negative for fracture or mass. No enhancing bone marrow
lesion identified.

Conus medullaris: Extends to the L1-2 level and appears normal.

Paraspinal and other soft tissues: Negative for paraspinous mass or
adenopathy.

Disc levels:

L1-2: Shallow central disc protrusion and mild facet degeneration.
Negative for stenosis

L2-3: Disc bulging and endplate spurring with mild retrolisthesis.
Small left foraminal disc protrusion. Bilateral mild facet
degeneration. Mild spinal stenosis. Moderate subarticular and
foraminal stenosis on the left. Mild subarticular stenosis on the
right.

L3-4: Disc degeneration with disc bulging and mild to moderate
central disc protrusion. Moderate facet and ligamentum flavum
hypertrophy. Severe spinal stenosis. Moderate to severe subarticular
stenosis bilaterally

L4-5: Disc degeneration with small central disc protrusion. Severe
facet degeneration bilaterally. 8 mm synovial cyst projecting
posterior to the facet joint on the left. Moderate to severe spinal
stenosis. Moderate subarticular stenosis bilaterally

L5-S1: Shallow central disc protrusion. Mild facet degeneration.
Negative for stenosis.
IMPRESSION: 1. No lesions suggestive of metastatic disease in the thoracic or
lumbar spine.
2. Bone scan findings consistent with degenerative change in the
thoracic and lumbar spine.
3. Multilevel stenosis in the lumbar spine. Severe spinal stenosis
L3-4 and moderate to severe spinal stenosis L4-5 due to disc and
facet degeneration.

## 2022-01-06 DIAGNOSIS — D3131 Benign neoplasm of right choroid: Secondary | ICD-10-CM | POA: Diagnosis not present

## 2022-01-06 DIAGNOSIS — T8579XA Infection and inflammatory reaction due to other internal prosthetic devices, implants and grafts, initial encounter: Secondary | ICD-10-CM | POA: Diagnosis not present

## 2022-01-06 DIAGNOSIS — H0102A Squamous blepharitis right eye, upper and lower eyelids: Secondary | ICD-10-CM | POA: Diagnosis not present

## 2022-01-06 DIAGNOSIS — H35711 Central serous chorioretinopathy, right eye: Secondary | ICD-10-CM | POA: Diagnosis not present

## 2022-01-06 DIAGNOSIS — Z961 Presence of intraocular lens: Secondary | ICD-10-CM | POA: Diagnosis not present

## 2022-01-06 DIAGNOSIS — H209 Unspecified iridocyclitis: Secondary | ICD-10-CM | POA: Diagnosis not present

## 2022-01-06 DIAGNOSIS — H353111 Nonexudative age-related macular degeneration, right eye, early dry stage: Secondary | ICD-10-CM | POA: Diagnosis not present

## 2022-01-06 DIAGNOSIS — H0102B Squamous blepharitis left eye, upper and lower eyelids: Secondary | ICD-10-CM | POA: Diagnosis not present

## 2022-01-11 ENCOUNTER — Ambulatory Visit: Payer: Medicare Other

## 2022-01-12 ENCOUNTER — Ambulatory Visit: Payer: Medicare Other

## 2022-01-13 ENCOUNTER — Ambulatory Visit: Payer: Medicare Other

## 2022-01-15 DIAGNOSIS — R0981 Nasal congestion: Secondary | ICD-10-CM | POA: Diagnosis not present

## 2022-01-15 DIAGNOSIS — L508 Other urticaria: Secondary | ICD-10-CM | POA: Diagnosis not present

## 2022-01-15 DIAGNOSIS — J310 Chronic rhinitis: Secondary | ICD-10-CM | POA: Diagnosis not present

## 2022-03-15 DIAGNOSIS — M48062 Spinal stenosis, lumbar region with neurogenic claudication: Secondary | ICD-10-CM | POA: Diagnosis not present

## 2022-03-15 DIAGNOSIS — G894 Chronic pain syndrome: Secondary | ICD-10-CM | POA: Diagnosis not present

## 2022-03-15 DIAGNOSIS — C61 Malignant neoplasm of prostate: Secondary | ICD-10-CM | POA: Diagnosis not present

## 2022-03-24 ENCOUNTER — Encounter (INDEPENDENT_AMBULATORY_CARE_PROVIDER_SITE_OTHER): Payer: PPO | Admitting: Ophthalmology

## 2022-03-24 DIAGNOSIS — M069 Rheumatoid arthritis, unspecified: Secondary | ICD-10-CM | POA: Diagnosis not present

## 2022-03-24 DIAGNOSIS — H353112 Nonexudative age-related macular degeneration, right eye, intermediate dry stage: Secondary | ICD-10-CM | POA: Diagnosis not present

## 2022-03-24 DIAGNOSIS — D3131 Benign neoplasm of right choroid: Secondary | ICD-10-CM

## 2022-03-24 DIAGNOSIS — Z79899 Other long term (current) drug therapy: Secondary | ICD-10-CM | POA: Diagnosis not present

## 2022-03-24 DIAGNOSIS — H43813 Vitreous degeneration, bilateral: Secondary | ICD-10-CM | POA: Diagnosis not present

## 2022-03-29 DIAGNOSIS — N401 Enlarged prostate with lower urinary tract symptoms: Secondary | ICD-10-CM | POA: Diagnosis not present

## 2022-03-29 DIAGNOSIS — C61 Malignant neoplasm of prostate: Secondary | ICD-10-CM | POA: Diagnosis not present

## 2022-03-29 DIAGNOSIS — R3915 Urgency of urination: Secondary | ICD-10-CM | POA: Diagnosis not present

## 2022-04-30 ENCOUNTER — Encounter (INDEPENDENT_AMBULATORY_CARE_PROVIDER_SITE_OTHER): Payer: Self-pay | Admitting: Ophthalmology

## 2022-04-30 ENCOUNTER — Ambulatory Visit (INDEPENDENT_AMBULATORY_CARE_PROVIDER_SITE_OTHER): Payer: PPO | Admitting: Ophthalmology

## 2022-04-30 DIAGNOSIS — H43813 Vitreous degeneration, bilateral: Secondary | ICD-10-CM

## 2022-04-30 DIAGNOSIS — G8911 Acute pain due to trauma: Secondary | ICD-10-CM | POA: Diagnosis not present

## 2022-04-30 DIAGNOSIS — Z961 Presence of intraocular lens: Secondary | ICD-10-CM

## 2022-04-30 DIAGNOSIS — H35711 Central serous chorioretinopathy, right eye: Secondary | ICD-10-CM

## 2022-04-30 DIAGNOSIS — H209 Unspecified iridocyclitis: Secondary | ICD-10-CM

## 2022-04-30 DIAGNOSIS — H353132 Nonexudative age-related macular degeneration, bilateral, intermediate dry stage: Secondary | ICD-10-CM

## 2022-04-30 DIAGNOSIS — H4042X0 Glaucoma secondary to eye inflammation, left eye, stage unspecified: Secondary | ICD-10-CM | POA: Diagnosis not present

## 2022-04-30 DIAGNOSIS — S0512XA Contusion of eyeball and orbital tissues, left eye, initial encounter: Secondary | ICD-10-CM | POA: Diagnosis not present

## 2022-04-30 DIAGNOSIS — T148XXA Other injury of unspecified body region, initial encounter: Secondary | ICD-10-CM | POA: Diagnosis not present

## 2022-04-30 DIAGNOSIS — T8579XA Infection and inflammatory reaction due to other internal prosthetic devices, implants and grafts, initial encounter: Secondary | ICD-10-CM | POA: Diagnosis not present

## 2022-04-30 NOTE — Progress Notes (Signed)
[facility] Note  04/30/2022     CHIEF COMPLAINT Patient presents for Retina Evaluation   HISTORY OF PRESENT ILLNESS: Carl [facility] is a 76 y.o. male who presents to the [facility] today for:   HPI     Retina Evaluation   In left eye.  This started 1 day ago.  Duration of 1 day.  Associated Symptoms Floaters.  Negative for Flashes, Distortion, Blind Spot, Pain, Redness, Photophobia, Glare, Trauma, Scalp Tenderness, Jaw Claudication, Shoulder/Hip pain, Fever, Weight Loss and Fatigue.  Context:  distance vision, mid-range vision and near vision.  I, the attending physician,  performed the HPI with the patient and updated documentation appropriately.        Comments   Patient fell yesterday and hit left side around eye socket. Patient sees floaters OS, but floaters have been present for awhile. Floaters are not worse since fall yesterday. No flashes. Patient has history of UGH syndrome OS. Is currently taking rocklatan qhs OS and combigan bid OS. Drops seem to control inflammation OS. IOL in slightly out of place OS, but this is not a new problem. Patient has been following Dr. Zigmund Jasmond for slipped IOL OS. 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      Last edited by Carl Caffey, MD on 04/30/2022  1:10 PM.    Pt is a [name] pt referred here today by Carl [facility] for concern of tear OS following a fall at the gym yesterday, he states he tripped over a cord and hit his face on the ground, pt states he saw his regular dr and got checked out and everything looks good, pt states his left eye is his only good eye due to CSR in the right, pt states this morning his eye was swollen and blood shot, pt is on Rocklatan and Combigan, pt has a displaced IOL OS and hx of UGH syndrome  Referring physician: [name], [name] [facility] [address] [place],  [place] [address]  HISTORICAL INFORMATION:   Selected notes from the MEDICAL RECORD NUMBER JDM pt  referred by Carl Schirmer, PA-C f [facility]:  Ocular Hx- PMH-    CURRENT MEDICATIONS: Current Outpatient Medications (Ophthalmic Drugs)  Medication Sig   ROCKLATAN 0.02-0.005 % SOLN Place 1 drop into the left eye at bedtime.   No current facility-administered medications for this visit. (Ophthalmic Drugs)   Current Outpatient Medications (Other)  Medication Sig   Bioflavonoid Products (GRAPE SEED PO) Take 1 fluid ounce by mouth daily.   Calcium Carb-Cholecalciferol (CALCIUM 600+D3 PO) Take 1 capsule by mouth daily.   Coenzyme Q10 (CO Q-10) 100 MG CAPS Take 1 capsule by mouth daily.   Ginkgo Biloba 120 MG TABS Take 120 mg by mouth daily.   Glucosamine-Chondroit-Vit C-Mn (GLUCOSAMINE 1500 COMPLEX) CAPS Take 1 capsule by mouth daily in the afternoon.   hydroxychloroquine (PLAQUENIL) 200 MG tablet Take 150 mg by mouth daily.   levocetirizine (XYZAL) 5 MG tablet Take 5 mg by mouth every evening.   MAGNESIUM OXIDE PO Take 800 mg by mouth daily.   Misc Natural Products (PROSTATE HEALTH PO) daily.   Misc Natural Products (TART CHERRY ADVANCED PO) Take 1 capsule by mouth daily.   Multiple Vitamins-Minerals (OCUVITE PO) Take 1 tablet by mouth daily.   Multiple Vitamins-Minerals (ONE-A-DAY MENS 50+ ADVANTAGE PO) Take by mouth daily.   OVER THE COUNTER MEDICATION daily. ZYFLAFEMD SUPPLEMENT   sildenafil (VIAGRA) 100 MG tablet Take 100  mg by mouth daily as needed for erectile dysfunction.   traMADol (ULTRAM) 50 MG tablet Take 25-50 mg by mouth every 6 (six) hours as needed.   acetaminophen (TYLENOL) 325 MG tablet Take 650 mg by mouth every 6 (six) hours as needed. (Patient not taking: Reported on 07/16/2021)   aspirin 325 MG tablet Take 71 mg by mouth daily. (Patient not taking: Reported on 04/08/2021)   No current facility-administered medications for this visit. (Other)   REVIEW OF SYSTEMS: ROS   Positive for: Musculoskeletal, Eyes Negative for: Constitutional, Gastrointestinal, Neurological,  Skin, Genitourinary, HENT, Endocrine, Cardiovascular, Respiratory, Psychiatric, Allergic/Imm, Heme/Lymph Last edited by Carl [facility], COT on 04/30/2022  1:00 PM.     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 [facility]-- dr Allene Pyo   chronic idiopathatic---  treated with xyral and plaquenil   BPH associated with nocturia    Chronic low back pain    followed by dr Vertell Limber   CSR (central serous retinopathy), right    per pt avoids any steriord medication , any form, 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   DDD (degenerative disc disease), lumbosacral    History of adenomatous polyp of colon    History of basal cell carcinoma (BCC) excision    s/p  moh's of nose approx 2012   History of chronic prostatitis    History of concussion    per pt yrs ago with no residual   History of gastroesophageal reflux (GERD)    History of kidney stones    Idiopathic urticaria    Lumbar spondylosis    Prostate cancer [facility]) urologist-- dr dahlstedt/ radiation onology--- dr Tammi Klippel   dx 11/ 2021,  Stage T1c, Gleason 4+4   Snores    per pt has not had a sleep study but uses dental appliance at home   Past Surgical History:  Procedure Laterality Date   CATARACT EXTRACTION W/ INTRAOCULAR LENS  IMPLANT, BILATERAL  2009;  2013 approx.   COLONOSCOPY WITH PROPOFOL N/A 08/01/2018   Procedure: COLONOSCOPY WITH PROPOFOL;  Surgeon: Laurence Spates, MD;  Location: [facility];  Service: Endoscopy;  Laterality: N/A;   CYSTOSCOPY  01/02/2021   Procedure: CYSTOSCOPY FLEXIBLE;  Surgeon: Carl Gallo, MD;  Location: [facility];  Service: Urology;;   MOHS SURGERY  2012  approx   POLYPECTOMY  08/01/2018   Procedure: POLYPECTOMY;  Surgeon: Laurence Spates, MD;  Location: [facility];  Service: Endoscopy;;   PROSTATE BIOPSY     RADIOACTIVE SEED IMPLANT N/A 01/02/2021   Procedure: RADIOACTIVE SEED IMPLANT/BRACHYTHERAPY IMPLANT;  Surgeon: Carl Gallo, MD;  Location: [facility];  Service: Urology;  Laterality: N/A;   SPACE OAR INSTILLATION N/A 01/02/2021   Procedure: SPACE OAR INSTILLATION;  Surgeon: Carl Gallo, MD;  Location: [facility];  Service: Urology;  Laterality: N/A;   TONSILLECTOMY  CHILD   FAMILY HISTORY Family History  Problem Relation Age of Onset   Colon cancer Mother    Colon cancer Cousin    Colon cancer Cousin    Colon cancer Cousin    Breast cancer Daughter    Prostate cancer Neg Hx  Pancreatic cancer Neg Hx    SOCIAL HISTORY Social History   Tobacco Use   Smoking status: Former    Years: 5.00    Types: Cigarettes    Quit date: 12/29/1968    Years since quitting: 53.3   Smokeless tobacco: Never  Vaping Use   Vaping Use: Never used  Substance Use Topics   Alcohol use: Yes   Drug use: Never       OPHTHALMIC EXAM:  Base Eye Exam     Visual Acuity (Snellen - Linear)       Right Left   Dist [employer] 20/150 +1 20/40 +1   Dist ph [place] NI NI         Tonometry (Tonopen, 1:11 PM)       Right Left   Pressure 10 14         Pupils       Dark Light Shape React APD   Right 3 2.5 Round Minimal None   Left 8 8 Round None None         Visual Fields (Counting fingers)       Left Right    Full Full         Neuro/Psych     Oriented x3: Yes   Mood/Affect: Normal         Dilation     Both eyes: 1.0% Mydriacyl, 2.5% Phenylephrine @ 1:11 PM           Slit Lamp and Fundus Exam     External Exam       Right Left   External  Periorbital abrasions         Slit Lamp Exam       Right Left   Lids/Lashes Dermatochalasis - upper lid, mild MGD Dermatochalasis - upper  lid, mild MGD   Conjunctiva/Sclera White and quiet Trace Injection   Cornea 2+ Punctate epithelial erosions, tear film debris, well healed cataract wound 2-3+ Punctate epithelial erosions inferiorly, mild arcus, nasal LRI, well healed cataract wound   Anterior Chamber deep and clear Deep, 3-4+cell/pigment ?RBC, mild vitreous prolapse superiorly   Iris Round and dilated Round and dilated   Lens PC IOL in good position Slightly displaced PC IOL   Anterior Vitreous Vitreous syneresis Vitreous syneresis, Posterior vitreous detachment, vitreous condensations         Fundus Exam       Right Left   Disc Pink and Sharp, Compact, temporal PPA Pink and Sharp   C/[facility] Ratio 0.3 0.3   Macula Flat, Blunted foveal reflex, central atrophy with pigment clumping Flat, Blunted foveal reflex, RPE mottling, No heme or edema   Vessels mild attenuation, mild tortuosity attenuated, mild tortuosity   Periphery Attached, pigmented cystoid degeneration, inferior paving stone degeneration Attached, reticular degeneration; no RT/RD           Refraction     Manifest Refraction       Dist [place]   Right 20/150   Left 20/40            IMAGING AND PROCEDURES  Imaging and Procedures for 04/30/2022  OCT, Retina - OU - Both Eyes       Right Eye Quality was good. Central Foveal Thickness: 150. Progression has been stable. Findings include normal foveal contour, no IRF, no SRF, retinal drusen , outer retinal atrophy (Stable central ORA).   Left Eye Quality was good. Central Foveal Thickness: 256. Progression has been stable. Findings include normal foveal contour, no IRF,  no SRF, retinal drusen .   Notes *Images captured and stored on drive  Diagnosis / Impression:  NFP, no IRF/SRF OU +drusen -- nonexudative ARMD  Clinical management:  See below  Abbreviations: NFP - Normal foveal profile. CME - cystoid macular edema. PED - pigment epithelial detachment. IRF - intraretinal fluid. SRF - subretinal  fluid. EZ - ellipsoid zone. ERM - epiretinal membrane. ORA - outer retinal atrophy. ORT - outer retinal tubulation. SRHM - subretinal hyper-reflective material. IRHM - intraretinal hyper-reflective material            ASSESSMENT/PLAN:    ICD-10-CM   1. Uveitis-hyphema-glaucoma syndrome of left eye  T85.398A    H20.9    H40.42X0     2. Central serous chorioretinopathy of right eye  H35.711 OCT, Retina - OU - Both Eyes    3. Intermediate stage nonexudative age-related macular degeneration of both eyes  H35.3132 OCT, Retina - OU - Both Eyes    4. Posterior vitreous detachment of both eyes  H43.813     5. Pseudophakia of both eyes  Z96.1      **Dr. Zigmund Deangleo pt presenting acutely after fall for concern of retinal tear OS -- referred by Carl Schirmer, PA-C**  UGH syndrome OS  - pt functionally monocular due CSCR w/ central atrophy OD  - sustained fall at the gym yesterday and presents with blurred vision OS  - denies photopsias; +floaters  - BCVA 20/40 OS from 20/20 on 07.26.23 w/ JDM  - exam shows increased cell/pigment and ?RBC in AC; +vitreous prolapse into superior AC  - recommend restarting PredForte QID OS - minimize activities, keep head elevated, avoid ASA/NSAIDs/blood thinners as able   - f/u in 1-2 wks  2. History of CSCR OD  - central atrophy OD -- BCVA 20/150 OD  - monitor  3. Age related macular degeneration, non-exudative, OU  - The incidence, anatomy, and pathology of dry AMD, risk of progression, and the AREDS and AREDS 2 studies including smoking risks discussed with patient.  - Recommend amsler grid monitoring  4. PVD OU  5. Pseudophakia OU  - s/p CE/IOL OU  - monitor   Ophthalmic Meds Ordered this visit:  No orders of the defined types were placed in this encounter.    Return for f/u 1-2 weeks.  There are no Patient Instructions on file for this visit.   Explained the diagnoses, plan, and follow up with the patient and they expressed  understanding.  Patient expressed understanding of the importance of proper follow up care.   This document serves as a record of services personally performed by Gardiner Sleeper, MD, PhD. It was created on their behalf by San Jetty. Owens Shark, OA an ophthalmic technician. The creation of this record is the provider's dictation and/or activities during the visit.    Electronically signed by: San Jetty. Owens Shark, [place] 09.01.2023 3:15 PM   Gardiner Sleeper, M.Flores., Ph.[facility]. Diseases & Surgery of the Retina and Vitreous [facility]  I have reviewed the above documentation for accuracy and completeness, and I agree with the above. Gardiner Sleeper, M.[facility]., Ph.[facility]. 04/30/22 3:15 PM   Abbreviations: M myopia (nearsighted); A astigmatism; H hyperopia (farsighted); P presbyopia; Mrx spectacle prescription;  CTL contact lenses; OD right eye; OS left eye; OU both eyes  XT exotropia; ET esotropia; PEK punctate epithelial keratitis; PEE punctate epithelial erosions; DES dry eye syndrome; MGD meibomian gland dysfunction; ATs artificial tears; PFAT's preservative free artificial tears; [place]  nuclear sclerotic cataract; PSC posterior subcapsular cataract; ERM epi-retinal membrane; PVD posterior vitreous detachment; RD retinal detachment; DM diabetes mellitus; DR diabetic retinopathy; NPDR non-proliferative diabetic retinopathy; PDR proliferative diabetic retinopathy; CSME clinically significant macular edema; DME diabetic macular edema; dbh dot blot hemorrhages; CWS cotton wool spot; POAG primary open angle glaucoma; C/[facility] cup-to-disc ratio; HVF humphrey visual field; GVF goldmann visual field; OCT optical coherence tomography; IOP intraocular pressure; BRVO Branch retinal vein occlusion; CRVO central retinal vein occlusion; CRAO central retinal artery occlusion; BRAO branch retinal artery occlusion; RT retinal tear; SB scleral buckle; PPV pars plana vitrectomy; VH Vitreous hemorrhage; PRP panretinal laser  photocoagulation; IVK intravitreal kenalog; VMT vitreomacular traction; MH Macular hole;  NVD neovascularization of the disc; NVE neovascularization elsewhere; AREDS age related eye disease study; ARMD age related macular degeneration; POAG primary open angle glaucoma; EBMD epithelial/anterior basement membrane dystrophy; ACIOL anterior chamber intraocular lens; IOL intraocular lens; PCIOL posterior chamber intraocular lens; Phaco/IOL phacoemulsification with intraocular lens placement; [place] photorefractive keratectomy; LASIK laser assisted in situ keratomileusis; HTN hypertension; DM diabetes mellitus; COPD chronic obstructive pulmonary disease

## 2022-04-30 NOTE — Progress Notes (Deleted)
[facility] Note  04/30/2022     CHIEF COMPLAINT Patient presents for Retina Evaluation   HISTORY OF PRESENT ILLNESS: Carl [facility] is a 76 y.o. male who presents to the clinic today for:   HPI     Retina Evaluation           Laterality: left eye   Onset: 1 day ago   Duration: 1 day   Associated Symptoms: Floaters.  Negative for Flashes, Distortion, Blind Spot, Pain, Redness, Photophobia, Glare, Trauma, Scalp Tenderness, Jaw Claudication, Shoulder/Hip pain, Fever, Weight Loss and Fatigue   Context: distance vision, mid-range vision and near vision         Comments   Patient fell yesterday and hit left side around eye socket. Patient sees floaters OS, but floaters have been present for awhile. Floaters are not worse since fall yesterday. No flashes. Patient has history of UGH syndrome OS. Is currently taking rocklatan qhs OS and combigan bid OS. Drops seem to control inflammation OS. IOL in slightly out of place OS, but this is not a new problem. Patient has been following Dr. Zigmund Jamaar for slipped IOL OS. 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      Last edited by Jobe Marker, COT on 04/30/2022  1:03 PM.      Referring physician: Alroy Dust, [name], [address] [place],  [place] [address]  HISTORICAL INFORMATION:   Selected notes from the MEDICAL RECORD NUMBER Referred by Dr. Truman Hayward:  Ocular Hx- PMH-    CURRENT MEDICATIONS: Current Outpatient Medications (Ophthalmic Drugs)  Medication Sig   ROCKLATAN 0.02-0.005 % SOLN Place 1 drop into the left eye at bedtime.   No current facility-administered medications for this visit. (Ophthalmic Drugs)   Current Outpatient Medications (Other)  Medication Sig   Bioflavonoid Products (GRAPE SEED PO) Take 1 fluid ounce by mouth daily.   Calcium Carb-Cholecalciferol (CALCIUM 600+D3 PO) Take 1 capsule by mouth daily.   Coenzyme Q10 (CO Q-10) 100 MG CAPS Take 1 capsule by mouth  daily.   Ginkgo Biloba 120 MG TABS Take 120 mg by mouth daily.   Glucosamine-Chondroit-Vit C-Mn (GLUCOSAMINE 1500 COMPLEX) CAPS Take 1 capsule by mouth daily in the afternoon.   hydroxychloroquine (PLAQUENIL) 200 MG tablet Take 150 mg by mouth daily.   levocetirizine (XYZAL) 5 MG tablet Take 5 mg by mouth every evening.   MAGNESIUM OXIDE PO Take 800 mg by mouth daily.   Misc Natural Products (PROSTATE HEALTH PO) daily.   Misc Natural Products (TART CHERRY ADVANCED PO) Take 1 capsule by mouth daily.   Multiple Vitamins-Minerals (OCUVITE PO) Take 1 tablet by mouth daily.   Multiple Vitamins-Minerals (ONE-A-DAY MENS 50+ ADVANTAGE PO) Take by mouth daily.   OVER THE COUNTER MEDICATION daily. ZYFLAFEMD SUPPLEMENT   sildenafil (VIAGRA) 100 MG tablet Take 100 mg by mouth daily as needed for erectile dysfunction.   traMADol (ULTRAM) 50 MG tablet Take 25-50 mg by mouth every 6 (six) hours as needed.   acetaminophen (TYLENOL) 325 MG tablet Take 650 mg by mouth every 6 (six) hours as needed. (Patient not taking: Reported on 07/16/2021)   aspirin 325 MG tablet Take 71 mg by mouth daily. (Patient not taking: Reported on 04/08/2021)   No current facility-administered medications for this visit. (Other)      REVIEW OF SYSTEMS: ROS   Positive for: Musculoskeletal, Eyes Negative for: Constitutional, Gastrointestinal, Neurological, Skin, Genitourinary, HENT, Endocrine, Cardiovascular, Respiratory, Psychiatric, Allergic/Imm,  Heme/Lymph Last edited by Roselee Nova D, COT on 04/30/2022  1:00 PM.       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 [facility]-- dr Allene Pyo   chronic  idiopathatic---  treated with xyral and plaquenil   BPH associated with nocturia    Chronic low back pain    followed by dr Vertell Limber   CSR (central serous retinopathy), right    per pt avoids any steriord medication , any form, 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   DDD (degenerative disc disease), lumbosacral    History of adenomatous polyp of colon    History of basal cell carcinoma (BCC) excision    s/p  moh's of nose approx 2012   History of chronic prostatitis    History of concussion    per pt yrs ago with no residual   History of gastroesophageal reflux (GERD)    History of kidney stones    Idiopathic urticaria    Lumbar spondylosis    Prostate cancer [facility]) urologist-- dr dahlstedt/ radiation onology--- dr Tammi Klippel   dx 11/ 2021,  Stage T1c, Gleason 4+4   Snores    per pt has not had a sleep study but uses dental appliance at home   Past Surgical History:  Procedure Laterality Date   CATARACT EXTRACTION W/ INTRAOCULAR LENS  IMPLANT, BILATERAL  2009;  2013 approx.   COLONOSCOPY WITH PROPOFOL N/A 08/01/2018   Procedure: COLONOSCOPY WITH PROPOFOL;  Surgeon: Laurence Spates, MD;  [facility];  Service: Endoscopy;  Laterality: N/A;   CYSTOSCOPY  01/02/2021   Procedure: CYSTOSCOPY FLEXIBLE;  Surgeon: Franchot Gallo, MD;  [facility];  Service: Urology;;   MOHS SURGERY  2012 approx   POLYPECTOMY  08/01/2018   Procedure: POLYPECTOMY;  Surgeon: Laurence Spates, MD;  [facility];  Service: Endoscopy;;   PROSTATE BIOPSY     RADIOACTIVE SEED IMPLANT N/A 01/02/2021   Procedure: RADIOACTIVE SEED IMPLANT/BRACHYTHERAPY IMPLANT;  Surgeon: Franchot Gallo, MD;  Location: [facility];  Service: Urology;  Laterality: N/A;   SPACE OAR INSTILLATION N/A 01/02/2021   Procedure: SPACE OAR INSTILLATION;  Surgeon: Franchot Gallo, MD;  Location: [facility];  Service: Urology;  Laterality: N/A;   TONSILLECTOMY  CHILD     FAMILY HISTORY Family History  Problem Relation Age of Onset   Colon cancer Mother    Colon cancer Cousin    Colon cancer Cousin    Colon cancer Cousin    Breast cancer Daughter    Prostate cancer Neg Hx    Pancreatic cancer Neg Hx     SOCIAL HISTORY Social History   Tobacco Use   Smoking status: Former    Years: 5.00    Types: Cigarettes    Quit date: 12/29/1968    Years since quitting: 53.3   Smokeless tobacco: Never  Vaping Use   Vaping Use: Never used  Substance Use Topics   Alcohol use: Yes   Drug use: Never         OPHTHALMIC EXAM:  Base Eye Exam     Visual Acuity (Snellen - Linear)  Right Left   Dist [place] 20/150 +1 20/40 +1   Dist ph [place]  NI            IMAGING AND PROCEDURES  Imaging and Procedures for 04/30/2022           ASSESSMENT/PLAN:    ICD-10-CM   1. Retinal edema  H35.81 OCT, Retina - OU - Both Eyes      1.  2.  3.  Ophthalmic Meds Ordered this visit:  No orders of the defined types were placed in this encounter.      No follow-ups on file.  There are no Patient Instructions on file for this visit.   Explained the diagnoses, plan, and follow up with the patient and they expressed understanding.  Patient expressed understanding of the importance of proper follow up care.     Gardiner Sleeper, M.D., Ph.D. Diseases & Surgery of the Retina and Vitreous [facility] '@TODAY'$ @     Abbreviations: M myopia (nearsighted); A astigmatism; H hyperopia (farsighted); P presbyopia; Mrx spectacle prescription;  CTL contact lenses; OD right eye; OS left eye; OU both eyes  XT exotropia; ET esotropia; PEK punctate epithelial keratitis; PEE punctate epithelial erosions; DES dry eye syndrome; MGD meibomian gland dysfunction; ATs artificial tears; PFAT's preservative free artificial tears; [place] nuclear sclerotic cataract; PSC posterior subcapsular cataract; ERM epi-retinal membrane; PVD posterior vitreous  detachment; RD retinal detachment; DM diabetes mellitus; DR diabetic retinopathy; NPDR non-proliferative diabetic retinopathy; PDR proliferative diabetic retinopathy; CSME clinically significant macular edema; DME diabetic macular edema; dbh dot blot hemorrhages; CWS cotton wool spot; POAG primary open angle glaucoma; C/D cup-to-disc ratio; HVF humphrey visual field; GVF goldmann visual field; OCT optical coherence tomography; IOP intraocular pressure; BRVO Branch retinal vein occlusion; CRVO central retinal vein occlusion; CRAO central retinal artery occlusion; BRAO branch retinal artery occlusion; RT retinal tear; SB scleral buckle; PPV pars plana vitrectomy; VH Vitreous hemorrhage; PRP panretinal laser photocoagulation; IVK intravitreal kenalog; VMT vitreomacular traction; MH Macular hole;  NVD neovascularization of the disc; NVE neovascularization elsewhere; AREDS age related eye disease study; ARMD age related macular degeneration; POAG primary open angle glaucoma; EBMD epithelial/anterior basement membrane dystrophy; ACIOL anterior chamber intraocular lens; IOL intraocular lens; PCIOL posterior chamber intraocular lens; Phaco/IOL phacoemulsification with intraocular lens placement; [place] photorefractive keratectomy; LASIK laser assisted in situ keratomileusis; HTN hypertension; DM diabetes mellitus; COPD chronic obstructive pulmonary disease

## 2022-05-06 NOTE — Progress Notes (Signed)
[facility] Note  05/12/2022     CHIEF COMPLAINT Patient presents for Retina Follow Up   HISTORY OF PRESENT ILLNESS: Carl Flores is a 76 y.o. male who presents to the clinic today for:   HPI     Retina Follow Up   Patient presents with  Other.  In left eye.  This started weeks ago.  Duration of 2 weeks.  Since onset it is stable.  I, the attending physician,  performed the HPI with the patient and updated documentation appropriately.        Comments   Patient feels that the vision has improved over the last two weeks.      Last edited by Bernarda Caffey, MD on 05/16/2022 12:58 AM.    Pt states his vision is basically back to baseline, maybe still a little fuzzy, he is using PF QID OS  Referring physician: Hayden Pedro, MD [address],  [place] [address]  HISTORICAL INFORMATION:   Selected notes from the [facility] pt referred by [name], PA-C f [name]:  Ocular Hx- PMH-    CURRENT MEDICATIONS: Current Outpatient Medications (Ophthalmic Drugs)  Medication Sig   ROCKLATAN 0.02-0.005 % SOLN Place 1 drop into the left eye at bedtime.   No current facility-administered medications for this visit. (Ophthalmic Drugs)   Current Outpatient Medications (Other)  Medication Sig   acetaminophen (TYLENOL) 325 MG tablet Take 650 mg by mouth every 6 (six) hours as needed. (Patient not taking: Reported on 07/16/2021)   aspirin 325 MG tablet Take 71 mg by mouth daily. (Patient not taking: Reported on 04/08/2021)   Bioflavonoid Products (GRAPE SEED PO) Take 1 fluid ounce by mouth daily.   Calcium Carb-Cholecalciferol (CALCIUM 600+D3 PO) Take 1 capsule by mouth daily.   Coenzyme Q10 (CO Q-10) 100 MG CAPS Take 1 capsule by mouth daily.   Ginkgo Biloba 120 MG TABS Take 120 mg by mouth daily.   Glucosamine-Chondroit-Vit C-Mn (GLUCOSAMINE 1500 COMPLEX) CAPS Take 1 capsule by mouth daily in the afternoon.    hydroxychloroquine (PLAQUENIL) 200 MG tablet Take 150 mg by mouth daily.   levocetirizine (XYZAL) 5 MG tablet Take 5 mg by mouth every evening.   MAGNESIUM OXIDE PO Take 800 mg by mouth daily.   Misc Natural Products (PROSTATE HEALTH PO) daily.   Misc Natural Products (TART CHERRY ADVANCED PO) Take 1 capsule by mouth daily.   Multiple Vitamins-Minerals (OCUVITE PO) Take 1 tablet by mouth daily.   Multiple Vitamins-Minerals (ONE-A-DAY MENS 50+ ADVANTAGE PO) Take by mouth daily.   OVER THE COUNTER MEDICATION daily. ZYFLAFEMD SUPPLEMENT   sildenafil (VIAGRA) 100 MG tablet Take 100 mg by mouth daily as needed for erectile dysfunction.   traMADol (ULTRAM) 50 MG tablet Take 25-50 mg by mouth every 6 (six) hours as needed.   No current facility-administered medications for this visit. (Other)   REVIEW OF SYSTEMS: ROS   Positive for: Musculoskeletal, Eyes Negative for: Constitutional, Gastrointestinal, Neurological, Skin, Genitourinary, HENT, Endocrine, Cardiovascular, Respiratory, Psychiatric, Allergic/Imm, Heme/Lymph Last edited by Annie Paras, COT on 05/12/2022  1:25 PM.     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 [facility]-- dr Allene Pyo   chronic idiopathatic---  treated with xyral and plaquenil   BPH associated with nocturia    Chronic low back pain    followed by dr Vertell Limber   CSR (central serous retinopathy), right    per pt avoids any steriord medication , any form, 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   DDD (degenerative disc disease), lumbosacral    History of adenomatous polyp of colon    History of basal cell carcinoma (BCC) excision     s/p  moh's of nose approx 2012   History of chronic prostatitis    History of concussion    per pt yrs ago with no residual   History of gastroesophageal reflux (GERD)    History of kidney stones    Idiopathic urticaria    Lumbar spondylosis    Prostate cancer [facility]) urologist-- dr dahlstedt/ radiation onology--- dr Tammi Klippel   dx 11/ 2021,  Stage T1c, Gleason 4+4   Snores    per pt has not had a sleep study but uses dental appliance at home   Past Surgical History:  Procedure Laterality Date   CATARACT EXTRACTION W/ INTRAOCULAR LENS  IMPLANT, BILATERAL  2009;  2013 approx.   COLONOSCOPY WITH PROPOFOL N/A 08/01/2018   Procedure: COLONOSCOPY WITH PROPOFOL;  Surgeon: Laurence Spates, MD;  Location: [facility];  Service: Endoscopy;  Laterality: N/A;   CYSTOSCOPY  01/02/2021   Procedure: CYSTOSCOPY FLEXIBLE;  Surgeon: Franchot Gallo, MD;  Location: [facility];  Service: Urology;;   MOHS SURGERY  2012 approx   POLYPECTOMY  08/01/2018   Procedure: POLYPECTOMY;  Surgeon: Laurence Spates, MD;  Location: [facility];  Service: Endoscopy;;   PROSTATE BIOPSY     RADIOACTIVE SEED IMPLANT N/A 01/02/2021   Procedure: RADIOACTIVE SEED IMPLANT/BRACHYTHERAPY IMPLANT;  Surgeon: Franchot Gallo, MD;  Location[facility];  Service: Urology;  Laterality: N/A;   SPACE OAR INSTILLATION N/A 01/02/2021   Procedure: SPACE OAR INSTILLATION;  Surgeon: Franchot Gallo, MD;  Location: [facility];  Service: Urology;  Laterality: N/A;   TONSILLECTOMY  CHILD   FAMILY HISTORY Family History  Problem Relation Age of Onset   Colon cancer Mother    Colon cancer Cousin    Colon cancer Cousin    Colon cancer Cousin    Breast cancer Daughter    Prostate cancer Neg Hx    Pancreatic cancer Neg Hx    SOCIAL HISTORY Social History   Tobacco Use   Smoking status: Former    Years: 5.00    Types: Cigarettes    Quit date: 12/29/1968    Years since quitting: 53.4    Smokeless tobacco: Never  Vaping Use   Vaping Use: Never used  Substance Use Topics   Alcohol use: Yes   Drug use: Never       OPHTHALMIC EXAM:  Base Eye Exam     Visual Acuity (Snellen - Linear)       Right Left   Dist [place] 20/150 20/25 +2   Dist ph Jay NI          Tonometry (Tonopen, 1:28 PM)       Right Left   Pressure 15 14         Pupils       Dark Light Shape React APD   Right 3 2 Round Minimal None   Left 7 7 Round NR  None         Visual Fields       Left Right    Full Full         Extraocular Movement       Right Left    Full, Ortho Full, Ortho         Neuro/Psych     Oriented x3: Yes   Mood/Affect: Normal         Dilation     Both eyes: 1.0% Mydriacyl, 2.5% Phenylephrine @ 1:26 PM           Slit Lamp and Fundus Exam     External Exam       Right Left   External  Periorbital abrasions         Slit Lamp Exam       Right Left   Lids/Lashes Dermatochalasis - upper lid, mild MGD Dermatochalasis - upper lid, mild MGD   Conjunctiva/Sclera White and quiet White and quiet   Cornea 2+ Punctate epithelial erosions, tear film debris, well healed cataract wound 1+ Punctate epithelial erosions inferiorly, mild arcus, nasal and temporal LRI, well healed cataract wound   Anterior Chamber deep and clear Deep, 0.5+fine cell/pigment, ?RBC, mild vitreous prolapse with +pigment ST   Iris Round and dilated Round and dilated   Lens PC IOL in good position Slightly displaced PC IOL   Anterior Vitreous Vitreous syneresis Vitreous syneresis, Posterior vitreous detachment, vitreous condensations         Fundus Exam       Right Left   Disc Pink and Sharp, Compact, temporal PPA Pink and Sharp, temporal PPP   C/D Ratio 0.4 0.3   Macula Flat, Blunted foveal reflex, central atrophy with pigment clumping Flat, Blunted foveal reflex, RPE mottling, No heme or edema   Vessels mild attenuation, mild tortuosity attenuated, mild tortuosity    Periphery Attached, pigmented cystoid degeneration, inferior paving stone degeneration Attached, reticular degeneration; no RT/RD           IMAGING AND PROCEDURES  Imaging and Procedures for 05/12/2022  OCT, Retina - OU - Both Eyes       Right Eye Quality was good. Central Foveal Thickness: 149. Progression has been stable. Findings include normal foveal contour, no IRF, no SRF, retinal drusen , outer retinal atrophy (Stable central ORA).   Left Eye Quality was good. Central Foveal Thickness: 258. Progression has been stable. Findings include normal foveal contour, no IRF, no SRF, retinal drusen .   Notes *Images captured and stored on drive  Diagnosis / Impression:  NFP, no IRF/SRF OU +drusen -- nonexudative ARMD  Clinical management:  See below  Abbreviations: NFP - Normal foveal profile. CME - cystoid macular edema. PED - pigment epithelial detachment. IRF - intraretinal fluid. SRF - subretinal fluid. EZ - ellipsoid zone. ERM - epiretinal membrane. ORA - outer retinal atrophy. ORT - outer retinal tubulation. SRHM - subretinal hyper-reflective material. IRHM - intraretinal hyper-reflective material            ASSESSMENT/PLAN:    ICD-10-CM   1. Uveitis-hyphema-glaucoma syndrome of left eye  T85.398A    H20.9    H40.42X0     2. Central serous chorioretinopathy of right eye  H35.711     3. Intermediate stage nonexudative age-related macular degeneration of both eyes  H35.3132 OCT, Retina - OU - Both Eyes    4. Posterior vitreous detachment of both eyes  H43.813     5. Pseudophakia of both eyes  Z96.1      UGH syndrome OS  - pt functionally monocular due CSCR w/ central atrophy OD  - sustained fall at the gym 04/29/22 and presented with blurred vision OS on 04/30/22  - denies photopsias; +floaters  - BCVA 20/25 OS from 20/40 on 09.01.23 following fall  - exam shows interval improvement in cell/pigment in [facility]; +vitreous prolapse into superior AC stable  - pt states  vision is back to baseline  - start PF taper -- 3,2,1 drops daily, decrease every 2 weeks - minimize activities, keep head elevated, avoid ASA/NSAIDs/blood thinners as able   - f/u in 2 months with Dr. Zigmund Gwyn   2. History of CSCR OD  - central atrophy OD -- BCVA 20/150 OD  - monitor  3. Age related macular degeneration, non-exudative, OU  - The incidence, anatomy, and pathology of dry AMD, risk of progression, and the AREDS and AREDS 2 studies including smoking risks discussed with patient.  - Recommend amsler grid monitoring  4. PVD OU  5. Pseudophakia OU  - s/p CE/IOL OU  - monitor   Ophthalmic Meds Ordered this visit:  No orders of the defined types were placed in this encounter.    Return in about 2 months (around 07/12/2022) for f/u with Dr. Zigmund Donn .  There are no Patient Instructions on file for this visit.   Explained the diagnoses, plan, and follow up with the patient and they expressed understanding.  Patient expressed understanding of the importance of proper follow up care.   This document serves as a record of services personally performed by Gardiner Sleeper, MD, PhD. It was created on their behalf by Orvan Falconer, an ophthalmic technician. The creation of this record is the provider's dictation and/or activities during the visit.    Electronically signed by: Orvan Falconer, OA, 05/16/22  1:03 AM  This document serves as a record of services personally performed by Gardiner Sleeper, MD, PhD. It was created on their behalf by San Jetty. Owens Shark, OA an ophthalmic technician. The creation of this record is the provider's dictation and/or activities during the visit.    Electronically signed by: San Jetty. Owens Shark, [place] 09.13.2023 1:03 AM  Gardiner Sleeper, M.D., Ph.D. Diseases & Surgery of the Retina and Vitreous [facility]  I have reviewed the above documentation for accuracy and completeness, and I agree with the above. Gardiner Sleeper,  M.D., Ph.D. 05/16/22 1:03 AM   Abbreviations: M myopia (nearsighted); A astigmatism; H hyperopia (farsighted); P presbyopia; Mrx spectacle prescription;  CTL contact lenses; OD right eye; OS left eye; OU both eyes  XT exotropia; ET esotropia; PEK punctate epithelial keratitis; PEE punctate epithelial erosions; DES dry eye syndrome; MGD meibomian gland dysfunction; ATs artificial tears; PFAT's preservative free artificial tears; [place] nuclear sclerotic cataract; PSC posterior subcapsular cataract; ERM epi-retinal membrane; PVD posterior vitreous detachment; RD retinal detachment; DM diabetes mellitus; DR diabetic retinopathy; NPDR non-proliferative diabetic retinopathy; PDR proliferative diabetic retinopathy; CSME clinically significant macular edema; DME diabetic macular edema; dbh dot blot hemorrhages; CWS cotton wool spot; POAG primary open angle glaucoma; C/D cup-to-disc ratio; HVF humphrey visual field; GVF goldmann visual field; OCT optical coherence tomography; IOP intraocular pressure; BRVO Branch retinal vein occlusion; CRVO central retinal vein occlusion; CRAO central retinal artery occlusion; BRAO branch retinal artery occlusion; RT retinal tear; SB scleral buckle; PPV pars plana vitrectomy; VH Vitreous hemorrhage; PRP panretinal laser photocoagulation; IVK intravitreal kenalog; VMT vitreomacular traction; MH Macular hole;  NVD  neovascularization of the disc; NVE neovascularization elsewhere; AREDS age related eye disease study; ARMD age related macular degeneration; POAG primary open angle glaucoma; EBMD epithelial/anterior basement membrane dystrophy; ACIOL anterior chamber intraocular lens; IOL intraocular lens; PCIOL posterior chamber intraocular lens; Phaco/IOL phacoemulsification with intraocular lens placement; [place] photorefractive keratectomy; LASIK laser assisted in situ keratomileusis; HTN hypertension; DM diabetes mellitus; COPD chronic obstructive pulmonary disease

## 2022-05-12 ENCOUNTER — Ambulatory Visit (INDEPENDENT_AMBULATORY_CARE_PROVIDER_SITE_OTHER): Payer: PPO | Admitting: Ophthalmology

## 2022-05-12 DIAGNOSIS — H353132 Nonexudative age-related macular degeneration, bilateral, intermediate dry stage: Secondary | ICD-10-CM

## 2022-05-12 DIAGNOSIS — Z961 Presence of intraocular lens: Secondary | ICD-10-CM

## 2022-05-12 DIAGNOSIS — H4042X0 Glaucoma secondary to eye inflammation, left eye, stage unspecified: Secondary | ICD-10-CM

## 2022-05-12 DIAGNOSIS — H43813 Vitreous degeneration, bilateral: Secondary | ICD-10-CM

## 2022-05-12 DIAGNOSIS — H35711 Central serous chorioretinopathy, right eye: Secondary | ICD-10-CM | POA: Diagnosis not present

## 2022-05-12 DIAGNOSIS — H209 Unspecified iridocyclitis: Secondary | ICD-10-CM | POA: Diagnosis not present

## 2022-05-16 ENCOUNTER — Encounter (INDEPENDENT_AMBULATORY_CARE_PROVIDER_SITE_OTHER): Payer: Self-pay | Admitting: Ophthalmology

## 2022-05-19 DIAGNOSIS — L309 Dermatitis, unspecified: Secondary | ICD-10-CM | POA: Diagnosis not present

## 2022-05-19 DIAGNOSIS — Z23 Encounter for immunization: Secondary | ICD-10-CM | POA: Diagnosis not present

## 2022-05-19 DIAGNOSIS — K4090 Unilateral inguinal hernia, without obstruction or gangrene, not specified as recurrent: Secondary | ICD-10-CM | POA: Diagnosis not present

## 2022-06-23 DIAGNOSIS — C61 Malignant neoplasm of prostate: Secondary | ICD-10-CM | POA: Diagnosis not present

## 2022-06-25 DIAGNOSIS — G894 Chronic pain syndrome: Secondary | ICD-10-CM | POA: Diagnosis not present

## 2022-06-25 DIAGNOSIS — M48062 Spinal stenosis, lumbar region with neurogenic claudication: Secondary | ICD-10-CM | POA: Diagnosis not present

## 2022-06-29 DIAGNOSIS — N183 Chronic kidney disease, stage 3 unspecified: Secondary | ICD-10-CM | POA: Diagnosis not present

## 2022-06-29 DIAGNOSIS — I700 Atherosclerosis of aorta: Secondary | ICD-10-CM | POA: Diagnosis not present

## 2022-06-29 DIAGNOSIS — Z136 Encounter for screening for cardiovascular disorders: Secondary | ICD-10-CM | POA: Diagnosis not present

## 2022-06-29 DIAGNOSIS — L309 Dermatitis, unspecified: Secondary | ICD-10-CM | POA: Diagnosis not present

## 2022-06-29 DIAGNOSIS — Z13220 Encounter for screening for lipoid disorders: Secondary | ICD-10-CM | POA: Diagnosis not present

## 2022-06-29 DIAGNOSIS — Z0000 Encounter for general adult medical examination without abnormal findings: Secondary | ICD-10-CM | POA: Diagnosis not present

## 2022-06-29 DIAGNOSIS — K4090 Unilateral inguinal hernia, without obstruction or gangrene, not specified as recurrent: Secondary | ICD-10-CM | POA: Diagnosis not present

## 2022-06-30 DIAGNOSIS — N401 Enlarged prostate with lower urinary tract symptoms: Secondary | ICD-10-CM | POA: Diagnosis not present

## 2022-06-30 DIAGNOSIS — D3501 Benign neoplasm of right adrenal gland: Secondary | ICD-10-CM | POA: Diagnosis not present

## 2022-06-30 DIAGNOSIS — N5201 Erectile dysfunction due to arterial insufficiency: Secondary | ICD-10-CM | POA: Diagnosis not present

## 2022-07-06 DIAGNOSIS — S0512XA Contusion of eyeball and orbital tissues, left eye, initial encounter: Secondary | ICD-10-CM | POA: Diagnosis not present

## 2022-07-06 DIAGNOSIS — T8579XD Infection and inflammatory reaction due to other internal prosthetic devices, implants and grafts, subsequent encounter: Secondary | ICD-10-CM | POA: Diagnosis not present

## 2022-07-06 DIAGNOSIS — H0102A Squamous blepharitis right eye, upper and lower eyelids: Secondary | ICD-10-CM | POA: Diagnosis not present

## 2022-07-06 DIAGNOSIS — G8911 Acute pain due to trauma: Secondary | ICD-10-CM | POA: Diagnosis not present

## 2022-07-06 DIAGNOSIS — H35711 Central serous chorioretinopathy, right eye: Secondary | ICD-10-CM | POA: Diagnosis not present

## 2022-07-06 DIAGNOSIS — Z961 Presence of intraocular lens: Secondary | ICD-10-CM | POA: Diagnosis not present

## 2022-07-06 DIAGNOSIS — D3131 Benign neoplasm of right choroid: Secondary | ICD-10-CM | POA: Diagnosis not present

## 2022-07-06 DIAGNOSIS — H353111 Nonexudative age-related macular degeneration, right eye, early dry stage: Secondary | ICD-10-CM | POA: Diagnosis not present

## 2022-07-06 DIAGNOSIS — H0102B Squamous blepharitis left eye, upper and lower eyelids: Secondary | ICD-10-CM | POA: Diagnosis not present

## 2022-07-06 DIAGNOSIS — H209 Unspecified iridocyclitis: Secondary | ICD-10-CM | POA: Diagnosis not present

## 2022-07-16 DIAGNOSIS — Z961 Presence of intraocular lens: Secondary | ICD-10-CM | POA: Diagnosis not present

## 2022-07-16 DIAGNOSIS — S0512XA Contusion of eyeball and orbital tissues, left eye, initial encounter: Secondary | ICD-10-CM | POA: Diagnosis not present

## 2022-07-16 DIAGNOSIS — H0102A Squamous blepharitis right eye, upper and lower eyelids: Secondary | ICD-10-CM | POA: Diagnosis not present

## 2022-07-16 DIAGNOSIS — H35711 Central serous chorioretinopathy, right eye: Secondary | ICD-10-CM | POA: Diagnosis not present

## 2022-07-16 DIAGNOSIS — G8911 Acute pain due to trauma: Secondary | ICD-10-CM | POA: Diagnosis not present

## 2022-07-16 DIAGNOSIS — H209 Unspecified iridocyclitis: Secondary | ICD-10-CM | POA: Diagnosis not present

## 2022-07-16 DIAGNOSIS — H0102B Squamous blepharitis left eye, upper and lower eyelids: Secondary | ICD-10-CM | POA: Diagnosis not present

## 2022-07-16 DIAGNOSIS — D3131 Benign neoplasm of right choroid: Secondary | ICD-10-CM | POA: Diagnosis not present

## 2022-07-16 DIAGNOSIS — H353110 Nonexudative age-related macular degeneration, right eye, stage unspecified: Secondary | ICD-10-CM | POA: Diagnosis not present

## 2022-07-16 DIAGNOSIS — T8579XD Infection and inflammatory reaction due to other internal prosthetic devices, implants and grafts, subsequent encounter: Secondary | ICD-10-CM | POA: Diagnosis not present

## 2022-07-26 DIAGNOSIS — K4020 Bilateral inguinal hernia, without obstruction or gangrene, not specified as recurrent: Secondary | ICD-10-CM | POA: Diagnosis not present

## 2022-07-27 ENCOUNTER — Encounter (INDEPENDENT_AMBULATORY_CARE_PROVIDER_SITE_OTHER): Payer: PPO | Admitting: Ophthalmology

## 2022-07-27 DIAGNOSIS — H4312 Vitreous hemorrhage, left eye: Secondary | ICD-10-CM

## 2022-07-27 DIAGNOSIS — D3131 Benign neoplasm of right choroid: Secondary | ICD-10-CM | POA: Diagnosis not present

## 2022-07-27 DIAGNOSIS — I10 Essential (primary) hypertension: Secondary | ICD-10-CM

## 2022-07-27 DIAGNOSIS — H43813 Vitreous degeneration, bilateral: Secondary | ICD-10-CM | POA: Diagnosis not present

## 2022-07-27 DIAGNOSIS — H353112 Nonexudative age-related macular degeneration, right eye, intermediate dry stage: Secondary | ICD-10-CM | POA: Diagnosis not present

## 2022-07-27 DIAGNOSIS — H35033 Hypertensive retinopathy, bilateral: Secondary | ICD-10-CM | POA: Diagnosis not present

## 2022-08-04 DIAGNOSIS — H209 Unspecified iridocyclitis: Secondary | ICD-10-CM | POA: Diagnosis not present

## 2022-08-04 DIAGNOSIS — H353111 Nonexudative age-related macular degeneration, right eye, early dry stage: Secondary | ICD-10-CM | POA: Diagnosis not present

## 2022-08-04 DIAGNOSIS — H0102A Squamous blepharitis right eye, upper and lower eyelids: Secondary | ICD-10-CM | POA: Diagnosis not present

## 2022-08-04 DIAGNOSIS — H35711 Central serous chorioretinopathy, right eye: Secondary | ICD-10-CM | POA: Diagnosis not present

## 2022-08-04 DIAGNOSIS — S0512XA Contusion of eyeball and orbital tissues, left eye, initial encounter: Secondary | ICD-10-CM | POA: Diagnosis not present

## 2022-08-04 DIAGNOSIS — G8911 Acute pain due to trauma: Secondary | ICD-10-CM | POA: Diagnosis not present

## 2022-08-04 DIAGNOSIS — Z961 Presence of intraocular lens: Secondary | ICD-10-CM | POA: Diagnosis not present

## 2022-08-04 DIAGNOSIS — T8579XD Infection and inflammatory reaction due to other internal prosthetic devices, implants and grafts, subsequent encounter: Secondary | ICD-10-CM | POA: Diagnosis not present

## 2022-08-04 DIAGNOSIS — D3131 Benign neoplasm of right choroid: Secondary | ICD-10-CM | POA: Diagnosis not present

## 2022-08-04 DIAGNOSIS — H0102B Squamous blepharitis left eye, upper and lower eyelids: Secondary | ICD-10-CM | POA: Diagnosis not present

## 2022-08-09 DIAGNOSIS — G8911 Acute pain due to trauma: Secondary | ICD-10-CM | POA: Diagnosis not present

## 2022-08-09 DIAGNOSIS — H209 Unspecified iridocyclitis: Secondary | ICD-10-CM | POA: Diagnosis not present

## 2022-08-09 DIAGNOSIS — D3131 Benign neoplasm of right choroid: Secondary | ICD-10-CM | POA: Diagnosis not present

## 2022-08-09 DIAGNOSIS — H0102A Squamous blepharitis right eye, upper and lower eyelids: Secondary | ICD-10-CM | POA: Diagnosis not present

## 2022-08-09 DIAGNOSIS — H353111 Nonexudative age-related macular degeneration, right eye, early dry stage: Secondary | ICD-10-CM | POA: Diagnosis not present

## 2022-08-09 DIAGNOSIS — Z961 Presence of intraocular lens: Secondary | ICD-10-CM | POA: Diagnosis not present

## 2022-08-09 DIAGNOSIS — T8579XD Infection and inflammatory reaction due to other internal prosthetic devices, implants and grafts, subsequent encounter: Secondary | ICD-10-CM | POA: Diagnosis not present

## 2022-08-09 DIAGNOSIS — S0512XA Contusion of eyeball and orbital tissues, left eye, initial encounter: Secondary | ICD-10-CM | POA: Diagnosis not present

## 2022-08-09 DIAGNOSIS — H0102B Squamous blepharitis left eye, upper and lower eyelids: Secondary | ICD-10-CM | POA: Diagnosis not present

## 2022-08-09 DIAGNOSIS — H35711 Central serous chorioretinopathy, right eye: Secondary | ICD-10-CM | POA: Diagnosis not present

## 2022-09-01 DIAGNOSIS — M48062 Spinal stenosis, lumbar region with neurogenic claudication: Secondary | ICD-10-CM | POA: Diagnosis not present

## 2022-09-06 DIAGNOSIS — H35711 Central serous chorioretinopathy, right eye: Secondary | ICD-10-CM | POA: Diagnosis not present

## 2022-09-06 DIAGNOSIS — T8579XD Infection and inflammatory reaction due to other internal prosthetic devices, implants and grafts, subsequent encounter: Secondary | ICD-10-CM | POA: Diagnosis not present

## 2022-09-06 DIAGNOSIS — H0102B Squamous blepharitis left eye, upper and lower eyelids: Secondary | ICD-10-CM | POA: Diagnosis not present

## 2022-09-06 DIAGNOSIS — Z961 Presence of intraocular lens: Secondary | ICD-10-CM | POA: Diagnosis not present

## 2022-09-06 DIAGNOSIS — S0512XA Contusion of eyeball and orbital tissues, left eye, initial encounter: Secondary | ICD-10-CM | POA: Diagnosis not present

## 2022-09-06 DIAGNOSIS — D3131 Benign neoplasm of right choroid: Secondary | ICD-10-CM | POA: Diagnosis not present

## 2022-09-06 DIAGNOSIS — G8911 Acute pain due to trauma: Secondary | ICD-10-CM | POA: Diagnosis not present

## 2022-09-06 DIAGNOSIS — H0102A Squamous blepharitis right eye, upper and lower eyelids: Secondary | ICD-10-CM | POA: Diagnosis not present

## 2022-09-06 DIAGNOSIS — H353111 Nonexudative age-related macular degeneration, right eye, early dry stage: Secondary | ICD-10-CM | POA: Diagnosis not present

## 2022-09-06 DIAGNOSIS — H209 Unspecified iridocyclitis: Secondary | ICD-10-CM | POA: Diagnosis not present

## 2022-10-06 ENCOUNTER — Encounter (INDEPENDENT_AMBULATORY_CARE_PROVIDER_SITE_OTHER): Payer: Self-pay | Admitting: Ophthalmology

## 2022-10-06 DIAGNOSIS — H4062X1 Glaucoma secondary to drugs, left eye, mild stage: Secondary | ICD-10-CM | POA: Diagnosis not present

## 2022-10-06 DIAGNOSIS — H4302 Vitreous prolapse, left eye: Secondary | ICD-10-CM | POA: Diagnosis not present

## 2022-10-06 DIAGNOSIS — Z961 Presence of intraocular lens: Secondary | ICD-10-CM | POA: Diagnosis not present

## 2022-10-29 DIAGNOSIS — M48062 Spinal stenosis, lumbar region with neurogenic claudication: Secondary | ICD-10-CM | POA: Diagnosis not present

## 2022-11-16 DIAGNOSIS — H209 Unspecified iridocyclitis: Secondary | ICD-10-CM | POA: Diagnosis not present

## 2022-11-16 DIAGNOSIS — T85398A Other mechanical complication of other ocular prosthetic devices, implants and grafts, initial encounter: Secondary | ICD-10-CM | POA: Diagnosis not present

## 2022-11-16 DIAGNOSIS — H4042X0 Glaucoma secondary to eye inflammation, left eye, stage unspecified: Secondary | ICD-10-CM | POA: Diagnosis not present

## 2022-11-16 DIAGNOSIS — Z961 Presence of intraocular lens: Secondary | ICD-10-CM | POA: Diagnosis not present

## 2022-11-18 DIAGNOSIS — M4316 Spondylolisthesis, lumbar region: Secondary | ICD-10-CM | POA: Diagnosis not present

## 2022-11-18 DIAGNOSIS — M48062 Spinal stenosis, lumbar region with neurogenic claudication: Secondary | ICD-10-CM | POA: Diagnosis not present

## 2022-11-18 DIAGNOSIS — M545 Low back pain, unspecified: Secondary | ICD-10-CM | POA: Diagnosis not present

## 2022-11-18 DIAGNOSIS — M47816 Spondylosis without myelopathy or radiculopathy, lumbar region: Secondary | ICD-10-CM | POA: Diagnosis not present

## 2022-11-25 DIAGNOSIS — M48062 Spinal stenosis, lumbar region with neurogenic claudication: Secondary | ICD-10-CM | POA: Diagnosis not present

## 2022-12-15 DIAGNOSIS — Z961 Presence of intraocular lens: Secondary | ICD-10-CM | POA: Diagnosis not present

## 2022-12-15 DIAGNOSIS — H35711 Central serous chorioretinopathy, right eye: Secondary | ICD-10-CM | POA: Diagnosis not present

## 2022-12-15 DIAGNOSIS — S0512XA Contusion of eyeball and orbital tissues, left eye, initial encounter: Secondary | ICD-10-CM | POA: Diagnosis not present

## 2022-12-15 DIAGNOSIS — H209 Unspecified iridocyclitis: Secondary | ICD-10-CM | POA: Diagnosis not present

## 2022-12-15 DIAGNOSIS — H0102B Squamous blepharitis left eye, upper and lower eyelids: Secondary | ICD-10-CM | POA: Diagnosis not present

## 2022-12-15 DIAGNOSIS — H0102A Squamous blepharitis right eye, upper and lower eyelids: Secondary | ICD-10-CM | POA: Diagnosis not present

## 2022-12-15 DIAGNOSIS — T8579XD Infection and inflammatory reaction due to other internal prosthetic devices, implants and grafts, subsequent encounter: Secondary | ICD-10-CM | POA: Diagnosis not present

## 2022-12-15 DIAGNOSIS — H353111 Nonexudative age-related macular degeneration, right eye, early dry stage: Secondary | ICD-10-CM | POA: Diagnosis not present

## 2022-12-15 DIAGNOSIS — D3131 Benign neoplasm of right choroid: Secondary | ICD-10-CM | POA: Diagnosis not present

## 2022-12-22 DIAGNOSIS — H0102A Squamous blepharitis right eye, upper and lower eyelids: Secondary | ICD-10-CM | POA: Diagnosis not present

## 2022-12-22 DIAGNOSIS — Z961 Presence of intraocular lens: Secondary | ICD-10-CM | POA: Diagnosis not present

## 2022-12-22 DIAGNOSIS — T8579XD Infection and inflammatory reaction due to other internal prosthetic devices, implants and grafts, subsequent encounter: Secondary | ICD-10-CM | POA: Diagnosis not present

## 2022-12-22 DIAGNOSIS — H0102B Squamous blepharitis left eye, upper and lower eyelids: Secondary | ICD-10-CM | POA: Diagnosis not present

## 2022-12-22 DIAGNOSIS — D3131 Benign neoplasm of right choroid: Secondary | ICD-10-CM | POA: Diagnosis not present

## 2022-12-22 DIAGNOSIS — H209 Unspecified iridocyclitis: Secondary | ICD-10-CM | POA: Diagnosis not present

## 2022-12-22 DIAGNOSIS — H353111 Nonexudative age-related macular degeneration, right eye, early dry stage: Secondary | ICD-10-CM | POA: Diagnosis not present

## 2022-12-22 DIAGNOSIS — S0512XA Contusion of eyeball and orbital tissues, left eye, initial encounter: Secondary | ICD-10-CM | POA: Diagnosis not present

## 2022-12-22 DIAGNOSIS — H35711 Central serous chorioretinopathy, right eye: Secondary | ICD-10-CM | POA: Diagnosis not present

## 2022-12-24 DIAGNOSIS — M4800 Spinal stenosis, site unspecified: Secondary | ICD-10-CM | POA: Diagnosis not present

## 2022-12-24 DIAGNOSIS — R32 Unspecified urinary incontinence: Secondary | ICD-10-CM | POA: Diagnosis not present

## 2022-12-24 DIAGNOSIS — I872 Venous insufficiency (chronic) (peripheral): Secondary | ICD-10-CM | POA: Diagnosis not present

## 2022-12-24 DIAGNOSIS — Z008 Encounter for other general examination: Secondary | ICD-10-CM | POA: Diagnosis not present

## 2022-12-24 DIAGNOSIS — Z87891 Personal history of nicotine dependence: Secondary | ICD-10-CM | POA: Diagnosis not present

## 2022-12-24 DIAGNOSIS — Z9181 History of falling: Secondary | ICD-10-CM | POA: Diagnosis not present

## 2022-12-24 DIAGNOSIS — Z803 Family history of malignant neoplasm of breast: Secondary | ICD-10-CM | POA: Diagnosis not present

## 2022-12-24 DIAGNOSIS — M069 Rheumatoid arthritis, unspecified: Secondary | ICD-10-CM | POA: Diagnosis not present

## 2022-12-24 DIAGNOSIS — N400 Enlarged prostate without lower urinary tract symptoms: Secondary | ICD-10-CM | POA: Diagnosis not present

## 2022-12-24 DIAGNOSIS — N529 Male erectile dysfunction, unspecified: Secondary | ICD-10-CM | POA: Diagnosis not present

## 2022-12-24 DIAGNOSIS — Z882 Allergy status to sulfonamides status: Secondary | ICD-10-CM | POA: Diagnosis not present

## 2022-12-24 DIAGNOSIS — I10 Essential (primary) hypertension: Secondary | ICD-10-CM | POA: Diagnosis not present

## 2022-12-24 DIAGNOSIS — Z8249 Family history of ischemic heart disease and other diseases of the circulatory system: Secondary | ICD-10-CM | POA: Diagnosis not present

## 2023-01-03 DIAGNOSIS — Z79899 Other long term (current) drug therapy: Secondary | ICD-10-CM | POA: Diagnosis not present

## 2023-01-03 DIAGNOSIS — M48062 Spinal stenosis, lumbar region with neurogenic claudication: Secondary | ICD-10-CM | POA: Diagnosis not present

## 2023-01-03 DIAGNOSIS — Z5181 Encounter for therapeutic drug level monitoring: Secondary | ICD-10-CM | POA: Diagnosis not present

## 2023-01-03 DIAGNOSIS — G894 Chronic pain syndrome: Secondary | ICD-10-CM | POA: Diagnosis not present

## 2023-02-09 DIAGNOSIS — H0102B Squamous blepharitis left eye, upper and lower eyelids: Secondary | ICD-10-CM | POA: Diagnosis not present

## 2023-02-09 DIAGNOSIS — Z961 Presence of intraocular lens: Secondary | ICD-10-CM | POA: Diagnosis not present

## 2023-02-09 DIAGNOSIS — T8579XD Infection and inflammatory reaction due to other internal prosthetic devices, implants and grafts, subsequent encounter: Secondary | ICD-10-CM | POA: Diagnosis not present

## 2023-02-09 DIAGNOSIS — S0512XA Contusion of eyeball and orbital tissues, left eye, initial encounter: Secondary | ICD-10-CM | POA: Diagnosis not present

## 2023-02-09 DIAGNOSIS — D3131 Benign neoplasm of right choroid: Secondary | ICD-10-CM | POA: Diagnosis not present

## 2023-02-09 DIAGNOSIS — H35711 Central serous chorioretinopathy, right eye: Secondary | ICD-10-CM | POA: Diagnosis not present

## 2023-02-09 DIAGNOSIS — H353111 Nonexudative age-related macular degeneration, right eye, early dry stage: Secondary | ICD-10-CM | POA: Diagnosis not present

## 2023-02-09 DIAGNOSIS — H0102A Squamous blepharitis right eye, upper and lower eyelids: Secondary | ICD-10-CM | POA: Diagnosis not present

## 2023-02-09 DIAGNOSIS — H209 Unspecified iridocyclitis: Secondary | ICD-10-CM | POA: Diagnosis not present

## 2023-03-23 ENCOUNTER — Encounter (INDEPENDENT_AMBULATORY_CARE_PROVIDER_SITE_OTHER): Payer: PPO | Admitting: Ophthalmology

## 2023-05-17 DIAGNOSIS — N183 Chronic kidney disease, stage 3 unspecified: Secondary | ICD-10-CM | POA: Diagnosis not present

## 2023-05-23 DIAGNOSIS — N529 Male erectile dysfunction, unspecified: Secondary | ICD-10-CM | POA: Diagnosis not present

## 2023-06-09 DIAGNOSIS — H209 Unspecified iridocyclitis: Secondary | ICD-10-CM | POA: Diagnosis not present

## 2023-06-09 DIAGNOSIS — Z961 Presence of intraocular lens: Secondary | ICD-10-CM | POA: Diagnosis not present

## 2023-06-09 DIAGNOSIS — H02053 Trichiasis without entropian right eye, unspecified eyelid: Secondary | ICD-10-CM | POA: Diagnosis not present

## 2023-06-09 DIAGNOSIS — H4042X1 Glaucoma secondary to eye inflammation, left eye, mild stage: Secondary | ICD-10-CM | POA: Diagnosis not present

## 2023-06-09 DIAGNOSIS — T85398A Other mechanical complication of other ocular prosthetic devices, implants and grafts, initial encounter: Secondary | ICD-10-CM | POA: Diagnosis not present

## 2023-07-01 DIAGNOSIS — I700 Atherosclerosis of aorta: Secondary | ICD-10-CM | POA: Diagnosis not present

## 2023-07-01 DIAGNOSIS — C61 Malignant neoplasm of prostate: Secondary | ICD-10-CM | POA: Diagnosis not present

## 2023-07-01 DIAGNOSIS — N183 Chronic kidney disease, stage 3 unspecified: Secondary | ICD-10-CM | POA: Diagnosis not present

## 2023-07-06 DIAGNOSIS — H2102 Hyphema, left eye: Secondary | ICD-10-CM | POA: Diagnosis not present

## 2023-07-06 DIAGNOSIS — T8522XA Displacement of intraocular lens, initial encounter: Secondary | ICD-10-CM | POA: Diagnosis not present

## 2023-07-06 DIAGNOSIS — H209 Unspecified iridocyclitis: Secondary | ICD-10-CM | POA: Diagnosis not present

## 2023-07-06 DIAGNOSIS — H5989 Other postprocedural complications and disorders of eye and adnexa, not elsewhere classified: Secondary | ICD-10-CM | POA: Diagnosis not present

## 2023-07-06 DIAGNOSIS — H4052X0 Glaucoma secondary to other eye disorders, left eye, stage unspecified: Secondary | ICD-10-CM | POA: Diagnosis not present

## 2023-08-11 DIAGNOSIS — H4042X0 Glaucoma secondary to eye inflammation, left eye, stage unspecified: Secondary | ICD-10-CM | POA: Diagnosis not present

## 2023-08-11 DIAGNOSIS — T85398A Other mechanical complication of other ocular prosthetic devices, implants and grafts, initial encounter: Secondary | ICD-10-CM | POA: Diagnosis not present

## 2023-08-11 DIAGNOSIS — Z961 Presence of intraocular lens: Secondary | ICD-10-CM | POA: Diagnosis not present

## 2023-08-11 DIAGNOSIS — H4062X1 Glaucoma secondary to drugs, left eye, mild stage: Secondary | ICD-10-CM | POA: Diagnosis not present

## 2023-08-11 DIAGNOSIS — H4302 Vitreous prolapse, left eye: Secondary | ICD-10-CM | POA: Diagnosis not present

## 2023-08-11 DIAGNOSIS — H209 Unspecified iridocyclitis: Secondary | ICD-10-CM | POA: Diagnosis not present

## 2023-08-11 DIAGNOSIS — Z79899 Other long term (current) drug therapy: Secondary | ICD-10-CM | POA: Diagnosis not present

## 2023-09-12 ENCOUNTER — Other Ambulatory Visit (HOSPITAL_COMMUNITY): Payer: Self-pay | Admitting: Family Medicine

## 2023-09-12 DIAGNOSIS — I2510 Atherosclerotic heart disease of native coronary artery without angina pectoris: Secondary | ICD-10-CM

## 2023-10-07 DIAGNOSIS — H209 Unspecified iridocyclitis: Secondary | ICD-10-CM | POA: Diagnosis not present

## 2023-10-07 DIAGNOSIS — T85398A Other mechanical complication of other ocular prosthetic devices, implants and grafts, initial encounter: Secondary | ICD-10-CM | POA: Diagnosis not present

## 2023-10-07 DIAGNOSIS — H4042X0 Glaucoma secondary to eye inflammation, left eye, stage unspecified: Secondary | ICD-10-CM | POA: Diagnosis not present

## 2023-10-18 DIAGNOSIS — K639 Disease of intestine, unspecified: Secondary | ICD-10-CM | POA: Diagnosis not present

## 2023-10-18 DIAGNOSIS — Z860100 Personal history of colon polyps, unspecified: Secondary | ICD-10-CM | POA: Diagnosis not present

## 2023-11-01 DIAGNOSIS — L218 Other seborrheic dermatitis: Secondary | ICD-10-CM | POA: Diagnosis not present

## 2023-11-01 DIAGNOSIS — L308 Other specified dermatitis: Secondary | ICD-10-CM | POA: Diagnosis not present

## 2023-12-12 DIAGNOSIS — H4042X0 Glaucoma secondary to eye inflammation, left eye, stage unspecified: Secondary | ICD-10-CM | POA: Diagnosis not present

## 2023-12-12 DIAGNOSIS — H4302 Vitreous prolapse, left eye: Secondary | ICD-10-CM | POA: Diagnosis not present

## 2023-12-12 DIAGNOSIS — H209 Unspecified iridocyclitis: Secondary | ICD-10-CM | POA: Diagnosis not present

## 2023-12-12 DIAGNOSIS — Z961 Presence of intraocular lens: Secondary | ICD-10-CM | POA: Diagnosis not present

## 2023-12-12 DIAGNOSIS — T85398A Other mechanical complication of other ocular prosthetic devices, implants and grafts, initial encounter: Secondary | ICD-10-CM | POA: Diagnosis not present

## 2023-12-12 DIAGNOSIS — H4062X1 Glaucoma secondary to drugs, left eye, mild stage: Secondary | ICD-10-CM | POA: Diagnosis not present

## 2023-12-14 NOTE — Progress Notes (Unsigned)
 Cardiology Office Note:  .   Date:  12/15/2023  ID:  Rosiland Cooks, DOB 02/20/1946, MRN [id number] PCP: Napolean Backbone.Rozelle Corning, MD (Inactive)  [facility] Providers Cardiologist:  None {   History of Present Illness: .    Chief Complaint  Patient presents with   Coronary Artery Disease    Blaine L Ode is a 78 y.o. male with history of CAD who presents for the evaluation of CAD at the request of Elida Grounds, DO.   History of Present Illness   AMILIO ZEHNDER is a 78 year old male who presents for evaluation of elevated coronary calcium score.  He has a recent coronary calcium score of 361, placing him in the 50th percentile for his age. No symptoms of chest pain or trouble breathing. He is currently taking an 81 mg baby aspirin daily and has not been on cholesterol medications. He wants to manage his cholesterol through diet and exercise.  He has a history of autoimmune urticaria, which has been resolved for over a year after discontinuing hydroxychloroquine due to concerns about long-term effects on eyesight. No recurrence of symptoms since stopping the medication.  He has a history of prostate cancer treated with Lupron, resulting in changes in muscle mass and fat distribution. He experienced a deliberate weight loss of 10 pounds prior to treatment but has since gained some weight back. He is actively working on rebuilding muscle mass through regular exercise at the gym five days a week.  He has a history of a non-spreadable type of skin cancer that was surgically removed. He also underwent nose surgery in the past.  He quit smoking at age 78 after smoking for approximately six years. He consumes very little alcohol.  His family history is significant for heart disease, with his mother experiencing heart issues in her late 55 and his father having heart attacks in his fifties and at age 22.  He is currently taking muscadine extract and xiflamine, which he  believes may help with cholesterol and PSA levels.          Problem List CAD -CAC 361 (50th percentile) 2. HLD -T chol 193, LDL 108,  TG 144, HDL 60 3. CKD 3a    ROS: All other ROS reviewed and negative. Pertinent positives noted in the HPI.     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 Left axis deviation Confirmed by Jackquelyn Mass [phone]) on 12/15/2023 3:36:59 PM   Physical Exam:   VS:  BP 134/88   Pulse 66   Ht 6' (1.829 m)   Wt 184 lb 3.2 oz (83.6 kg)   SpO2 98%   BMI 24.98 kg/m    Wt Readings from Last 3 Encounters:  12/15/23 184 lb 3.2 oz (83.6 kg)  07/16/21 185 lb 1.6 oz (84 kg)  01/05/21 139 lb 6.4 oz (63.2 kg)    GEN: Well nourished, well developed in no acute distress NECK: No JVD; No carotid bruits CARDIAC: RRR, no murmurs, rubs, gallops RESPIRATORY:  Clear to auscultation without rales, wheezing or rhonchi  ABDOMEN: Soft, non-tender, non-distended EXTREMITIES:  No edema; No deformity  ASSESSMENT AND PLAN: .   Assessment and Plan    Elevated Coronary Calcium Score Coronary calcium score of 361  indicates atherosclerosis, asymptomatic, no further imaging needed. - Continue baby aspirin 81 mg daily. - Encourage lifestyle modifications including diet and exercise to manage cholesterol levels. - Re-evaluate in six months with a fasting lipid panel prior to the appointment. - EKG is normal. No CP or SOB. No need for stress test or CCTA at this time.   Hyperlipidemia LDL cholesterol is 108 mg/dL. Goal is LDL <100 mg/dL, ideally <82 mg/dL. Prefers lifestyle modifications over statin therapy. - Encourage dietary changes and increased physical activity to lower LDL cholesterol. - Reassess LDL cholesterol in six months with a fasting lipid  panel.  Chronic Kidney Disease IIIa Kidney disease is well-controlled with no acute issues.              Follow-up: Return in about 6 months (around 06/15/2024).   Signed, Gigi Kyle. Rolm Clos, MD, [facility]  [facility]  [address] [place], [place] [address] [phone]  3:49 PM

## 2023-12-15 ENCOUNTER — Ambulatory Visit: Attending: Cardiovascular Disease | Admitting: Cardiovascular Disease

## 2023-12-15 ENCOUNTER — Encounter: Payer: Self-pay | Admitting: Cardiovascular Disease

## 2023-12-15 VITALS — BP 134/88 | HR 66 | Ht 72.0 in | Wt 184.2 lb

## 2023-12-15 DIAGNOSIS — I440 Atrioventricular block, first degree: Secondary | ICD-10-CM | POA: Diagnosis not present

## 2023-12-15 DIAGNOSIS — E782 Mixed hyperlipidemia: Secondary | ICD-10-CM | POA: Diagnosis not present

## 2023-12-15 DIAGNOSIS — R931 Abnormal findings on diagnostic imaging of heart and coronary circulation: Secondary | ICD-10-CM

## 2023-12-15 NOTE — Patient Instructions (Signed)
   Lab Work:  Your physician recommends that you return for lab work in: 6 MONTHS-FASTING PRIOR TO FOLLOW UP APPOINTMENT   If you have labs (blood work) drawn today and your tests are completely normal, you will receive your results only by: MyChart Message (if you have MyChart) OR A paper copy in the mail If you have any lab test that is abnormal or we need to change your treatment, we will call you to review the results.    Follow-Up: At [facility] you and your health needs are our priority.  As part of our continuing mission to provide you with exceptional heart care, our providers are all part of one team.  This team includes your primary Cardiologist (physician) and Advanced Practice Providers or APPs (Physician Assistants and Nurse Practitioners) who all work together to provide you with the care you need, when you need it.  Your next appointment:   6 month(s)  Provider:   Jackquelyn Mass MD         1st Floor: - Lobby - Registration  - Pharmacy  - Lab - Cafe  2nd Floor: - PV Lab - Diagnostic Testing (echo, CT, nuclear med)  3rd Floor: - Vacant  4th Floor: - TCTS (cardiothoracic surgery) - AFib Clinic - Structural Heart Clinic - Vascular Surgery  - Vascular Ultrasound  5th Floor: - HeartCare Cardiology (general and EP) - Clinical Pharmacy for coumadin, hypertension, lipid, weight-loss medications, and med management appointments    Valet parking services will be available as well.

## 2024-01-17 DIAGNOSIS — H0102B Squamous blepharitis left eye, upper and lower eyelids: Secondary | ICD-10-CM | POA: Diagnosis not present

## 2024-01-17 DIAGNOSIS — H209 Unspecified iridocyclitis: Secondary | ICD-10-CM | POA: Diagnosis not present

## 2024-01-17 DIAGNOSIS — Z961 Presence of intraocular lens: Secondary | ICD-10-CM | POA: Diagnosis not present

## 2024-01-17 DIAGNOSIS — H40051 Ocular hypertension, right eye: Secondary | ICD-10-CM | POA: Diagnosis not present

## 2024-01-17 DIAGNOSIS — D3131 Benign neoplasm of right choroid: Secondary | ICD-10-CM | POA: Diagnosis not present

## 2024-01-17 DIAGNOSIS — H0102A Squamous blepharitis right eye, upper and lower eyelids: Secondary | ICD-10-CM | POA: Diagnosis not present

## 2024-01-17 DIAGNOSIS — H35711 Central serous chorioretinopathy, right eye: Secondary | ICD-10-CM | POA: Diagnosis not present

## 2024-01-25 ENCOUNTER — Ambulatory Visit: Payer: BLUE CROSS/BLUE SHIELD | Admitting: Cardiovascular Disease

## 2024-02-07 DIAGNOSIS — L308 Other specified dermatitis: Secondary | ICD-10-CM | POA: Diagnosis not present

## 2024-02-07 DIAGNOSIS — L218 Other seborrheic dermatitis: Secondary | ICD-10-CM | POA: Diagnosis not present

## 2024-03-16 DIAGNOSIS — M25562 Pain in left knee: Secondary | ICD-10-CM | POA: Diagnosis not present

## 2024-04-25 DIAGNOSIS — T8579XD Infection and inflammatory reaction due to other internal prosthetic devices, implants and grafts, subsequent encounter: Secondary | ICD-10-CM | POA: Diagnosis not present

## 2024-04-25 DIAGNOSIS — H35711 Central serous chorioretinopathy, right eye: Secondary | ICD-10-CM | POA: Diagnosis not present

## 2024-04-25 DIAGNOSIS — H209 Unspecified iridocyclitis: Secondary | ICD-10-CM | POA: Diagnosis not present

## 2024-04-25 DIAGNOSIS — S0512XA Contusion of eyeball and orbital tissues, left eye, initial encounter: Secondary | ICD-10-CM | POA: Diagnosis not present

## 2024-04-25 DIAGNOSIS — D3131 Benign neoplasm of right choroid: Secondary | ICD-10-CM | POA: Diagnosis not present

## 2024-04-25 DIAGNOSIS — H40051 Ocular hypertension, right eye: Secondary | ICD-10-CM | POA: Diagnosis not present

## 2024-06-29 DIAGNOSIS — I2510 Atherosclerotic heart disease of native coronary artery without angina pectoris: Secondary | ICD-10-CM | POA: Diagnosis not present

## 2024-06-29 DIAGNOSIS — I700 Atherosclerosis of aorta: Secondary | ICD-10-CM | POA: Diagnosis not present

## 2024-06-29 DIAGNOSIS — Z8546 Personal history of malignant neoplasm of prostate: Secondary | ICD-10-CM | POA: Diagnosis not present

## 2024-06-29 DIAGNOSIS — Z0000 Encounter for general adult medical examination without abnormal findings: Secondary | ICD-10-CM | POA: Diagnosis not present

## 2024-06-29 DIAGNOSIS — N1831 Chronic kidney disease, stage 3a: Secondary | ICD-10-CM | POA: Diagnosis not present

## 2024-06-29 DIAGNOSIS — E782 Mixed hyperlipidemia: Secondary | ICD-10-CM | POA: Diagnosis not present

## 2024-06-29 DIAGNOSIS — Z1331 Encounter for screening for depression: Secondary | ICD-10-CM | POA: Diagnosis not present

## 2024-06-30 LAB — LIPID PANEL
Chol/HDL Ratio: 3.1 ratio (ref 0.0–5.0)
Cholesterol, Total: 193 mg/dL (ref 100–199)
HDL: 63 mg/dL (ref >39)
LDL Chol Calc (NIH): 106 mg/dL — ABNORMAL HIGH (ref 0–99)
Triglycerides: 135 mg/dL (ref 0–149)
VLDL Cholesterol Cal: 24 mg/dL (ref 5–40)

## 2024-07-01 ENCOUNTER — Ambulatory Visit: Payer: Self-pay | Admitting: Cardiovascular Disease

## 2024-07-01 NOTE — Progress Notes (Unsigned)
  Cardiology Office Note:  .   Date:  07/03/2024  ID:  Carl Flores Jobs, DOB May 29, 1946, MRN [id number] PCP: Merilee Flores.[name], MD (Inactive)  [name] Providers Cardiologist:  [name], MD  History of Present Illness: .    Chief Complaint  Patient presents with   Follow-up         Carl Flores is a 78 y.o. male with history of CAD, HLD who presents for follow-up.    History of Present Illness   Carl Flores is an 78 year old male with elevated coronary calcium score and hyperlipidemia who presents for follow-up.  He has a history of an elevated coronary calcium score of 361, placing him in the 50th percentile. His most recent LDL cholesterol level was 893 mg/dL. Despite following a low-fat diet, he reports that his lipid levels have not changed much.  He has a history of hyperlipidemia with past LDL levels around 195 mg/dL, which decreased to 843 mg/dL after starting muscadine extract. However, following cancer therapy, his LDL increased back to 195 mg/dL. He continues to take muscadine extract.  No chest pain or difficulty breathing. He maintains an active lifestyle, exercising at the gym 5-6 days a week, including an hour of cardio and machine workouts every other day. He also consumes a lot of green tea.  He is currently taking baby aspirin and xiflamine and notes easy bruising. He monitors his blood pressure, which was 124 mmHg recently, although it was noted to be slightly elevated today.          Problem List CAD -CAC 361 (50th percentile) 2. HLD -T chol 193, HDL 63, LDL 106, TG 135 3. CKD 3a    ROS: All other ROS reviewed and negative. Pertinent positives noted in the HPI.     Studies Reviewed: .       CT CAC 361 (50th percentile) Physical Exam:   VS:  BP (!) 140/72 (BP Location: Left Arm, Patient Position: Sitting, Cuff Size: Normal)   Pulse 62   Ht 6' (1.829 m)   Wt 176 lb (79.8 kg)   SpO2 98%   BMI 23.87 kg/m    Wt Readings  from Last 3 Encounters:  07/03/24 176 lb (79.8 kg)  12/15/23 184 lb 3.2 oz (83.6 kg)  07/16/21 185 lb 1.6 oz (84 kg)    GEN: Well nourished, well developed in no acute distress NECK: No JVD; No carotid bruits CARDIAC: RRR, no murmurs, rubs, gallops RESPIRATORY:  Clear to auscultation without rales, wheezing or rhonchi  ABDOMEN: Soft, non-tender, non-distended EXTREMITIES:  No edema; No deformity  ASSESSMENT AND PLAN: .   Assessment and Plan    Hyperlipidemia LDL cholesterol at 106 mg/dL, above target. Zetia considered due to statin concerns. - Started Zetia 10 mg daily. - Scheduled fasting lipid panel in six weeks. - Continue muscadine extract supplementation.  Elevated coronary artery calcium score Coronary artery calcium score of 361, indicating increased cardiovascular risk. - ASA + zetia as above              Follow-up: Return in about 1 year (around 07/03/2025).   Signed, Darryle ONEIDA. Decent, MD, [facility]  [facility]  [address] [place], [place] [address] [phone]  2:32 PM

## 2024-07-03 ENCOUNTER — Other Ambulatory Visit: Payer: Self-pay | Admitting: *Deleted

## 2024-07-03 ENCOUNTER — Encounter: Payer: Self-pay | Admitting: Cardiovascular Disease

## 2024-07-03 ENCOUNTER — Ambulatory Visit: Attending: Cardiovascular Disease | Admitting: Cardiovascular Disease

## 2024-07-03 VITALS — BP 140/72 | HR 62 | Ht 72.0 in | Wt 176.0 lb

## 2024-07-03 DIAGNOSIS — E782 Mixed hyperlipidemia: Secondary | ICD-10-CM | POA: Diagnosis not present

## 2024-07-03 DIAGNOSIS — R931 Abnormal findings on diagnostic imaging of heart and coronary circulation: Secondary | ICD-10-CM

## 2024-07-03 MED ORDER — EZETIMIBE 10 MG PO TABS: 10.0000 mg | ORAL_TABLET | Freq: Every day | ORAL | 3 refills | Status: AC

## 2024-07-03 NOTE — Patient Instructions (Addendum)
 Medication Instructions:  Start: Ezetimibe (Zetia) 10 mg, one tablet, once daily   Lab Work: To do in about 6 weeks - FASTING Lipid Panel (okay to have water and/or black coffee)  Follow-Up: At [facility] you and your health needs are our priority.  As part of our continuing mission to provide you with exceptional heart care, our providers are all part of one team.  This team includes your primary Cardiologist (physician) and Advanced Practice Providers or APPs (Physician Assistants and Nurse Practitioners) who all work together to provide you with the care you need, when you need it.  Your next appointment:   1 year(s)  Provider:   Available APP (Advanced Practice Provider; Nurse Practitioner or Physician's Assistant)

## 2024-08-08 DIAGNOSIS — L578 Other skin changes due to chronic exposure to nonionizing radiation: Secondary | ICD-10-CM | POA: Diagnosis not present

## 2024-08-08 DIAGNOSIS — L218 Other seborrheic dermatitis: Secondary | ICD-10-CM | POA: Diagnosis not present

## 2024-08-08 DIAGNOSIS — W908XXS Exposure to other nonionizing radiation, sequela: Secondary | ICD-10-CM | POA: Diagnosis not present

## 2024-08-10 DIAGNOSIS — E782 Mixed hyperlipidemia: Secondary | ICD-10-CM | POA: Diagnosis not present

## 2024-08-11 LAB — LIPID PANEL
Chol/HDL Ratio: 2.5 ratio (ref 0.0–5.0)
Cholesterol, Total: 180 mg/dL (ref 100–199)
HDL: 73 mg/dL (ref >39)
LDL Chol Calc (NIH): 91 mg/dL (ref 0–99)
Triglycerides: 87 mg/dL (ref 0–149)
VLDL Cholesterol Cal: 16 mg/dL (ref 5–40)

## 2024-08-12 ENCOUNTER — Ambulatory Visit: Payer: Self-pay | Admitting: Cardiovascular Disease
